# Patient Record
Sex: Female | Born: 1980 | Race: Black or African American | Hispanic: No | Marital: Single
Health system: Midwestern US, Community
[De-identification: ages and names within clinical notes are randomized; demographics above are authoritative.]

## PROBLEM LIST (undated history)

## (undated) DIAGNOSIS — A6000 Herpesviral infection of urogenital system, unspecified: Secondary | ICD-10-CM

## (undated) HISTORY — PX: TUBAL LIGATION: SHX77

---

## 2015-07-29 DIAGNOSIS — R079 Chest pain, unspecified: Secondary | ICD-10-CM

## 2015-07-29 NOTE — ED Triage Notes (Signed)
Patient c/o chest pain that's reproducible lasting for over 24 hrs. Pt recently seen and [facility] for the same complaint at 1pm today. Pt alert/4 vss no acute distress, pt awaiting MD evaluation.

## 2015-07-29 NOTE — Other (Signed)
I have reviewed discharge instructions with the patient.  The patient verbalized understanding.

## 2015-07-29 NOTE — ED Provider Notes (Signed)
HPI Comments: Christina Stewart is a 34 y.o. female who presents to the ED via EMS with complaint of gradually worsening left sided chest pain that began yesterday while lying down. She describes the pain as a "10/10" and pulling sensation. The pain radiates to her Lt scapula and Lt upper arm and is excarbated with palpation. Pain worsens with breathing and movement of arm, shoulder and changing position.  She does not have SOB, but breathing is uncomfortable. She has not taken any medication specifically for the management of her pain, but has taken fioricet for management of her HA. Denies cough, fever, leg swelling, heavy lifting, hx of drug use (specifically cocaine), hx of DVT/PE or the use of oral contraceptives. She has been under a significant amount of stress lately. She was evaluated in the ED at [facility] for a migraine HA earlier today, however she did not address her chest pain while she was there. She also notes an episode of dizziness and lightheadedness ~1 hour after taking Fioricet which has improved since. She has an appointment with her PCP on 08/05/2015.       Patient is a 34 y.o. female presenting with chest pain. The history is provided by the patient and medical records.   Chest Pain (Angina)    This is a new problem. The current episode started yesterday. The problem has been gradually worsening. The problem occurs constantly. The pain is present in the left side. The pain is at a severity of 10/10. The pain is moderate. The quality of the pain is described as tearing sensation. The pain radiates to the mid back and left arm. The symptoms are aggravated by palpation. Associated symptoms include dizziness. Pertinent negatives include no abdominal pain, no back pain, no cough, no diaphoresis, no fever, no headaches, no nausea, no palpitations, no shortness of breath, no vomiting and no weakness. She has tried OTC pain medications for the  symptoms. Risk factors include stress. Her past medical history does not include DVT or PE.        History reviewed. No pertinent past medical history.    History reviewed. No pertinent past surgical history.      History reviewed. No pertinent family history.    Social History     Social History   ??? Marital status: N/A     Spouse name: N/A   ??? Number of children: N/A   ??? Years of education: N/A     Occupational History   ??? Not on file.     Social History Main Topics   ??? Smoking status: Current Some Day Smoker   ??? Smokeless tobacco: Not on file   ??? Alcohol use No   ??? Drug use: Yes     Special: Marijuana   ??? Sexual activity: Yes     Other Topics Concern   ??? Not on file     Social History Narrative   ??? No narrative on file     ALLERGIES: Review of patient's allergies indicates no known allergies.    Review of Systems   Constitutional: Negative.  Negative for diaphoresis and fever.   HENT: Negative.  Negative for rhinorrhea and sore throat.    Respiratory: Negative for cough, chest tightness and shortness of breath.    Cardiovascular: Positive for chest pain. Negative for palpitations and leg swelling.   Gastrointestinal: Negative.  Negative for abdominal pain, nausea and vomiting.   Genitourinary: Negative.  Negative for difficulty urinating and dysuria.   Musculoskeletal: Negative.  Negative for back pain and joint swelling.   Skin: Negative.  Negative for rash and wound.   Allergic/Immunologic: Negative.  Negative for food allergies and immunocompromised state.   Neurological: Positive for dizziness and light-headedness. Negative for weakness and headaches.   All other systems reviewed and are negative.      Vitals:    07/29/15 2016   BP: (!) 129/92   Pulse: 72   Resp: 18   Temp: 98.6 ??F (37 ??C)   SpO2: 99%            Physical Exam   Constitutional: She is oriented to person, place, and time. She appears well-developed and well-nourished. No distress.   Well-appearing, intermittently anxious   HENT:    Head: Normocephalic and atraumatic.   Mouth/Throat: Oropharynx is clear and moist.   Eyes: Conjunctivae are normal. Right eye exhibits no discharge. Left eye exhibits no discharge.   Neck: Normal range of motion. Neck supple.   Cardiovascular: Normal rate, regular rhythm and normal heart sounds.  Exam reveals no gallop and no friction rub.    No murmur heard.  Pulmonary/Chest: Effort normal and breath sounds normal. No respiratory distress. She has no wheezes. She has no rales. She exhibits tenderness (reproduces pain).   Abdominal: Soft. She exhibits no distension and no mass. There is no tenderness. There is no rebound and no guarding.   Musculoskeletal: Normal range of motion. She exhibits tenderness (left upper back, reproduces the pain). She exhibits no edema.   No calf tenderness   Neurological: She is alert and oriented to person, place, and time.   Moving all extremities with no focality   Skin: Skin is warm and dry. No rash noted. She is not diaphoretic.   Nursing note and vitals reviewed.       MDM  Number of Diagnoses or Management Options  Chest pain, unspecified type:   Diagnosis management comments: 34 y.o. female with chest pain. PERC negative, character not consistent with ACS. Pt for screening EKG, pain control and PMD f/u    Plan:   EKG  Tylenol  Heat pack  Reassurance  PMD f/u  -----  8:38 PM  Documented by Ileene Rubens, acting as a scribe for Dr. Lujean Rave, MD      PROVIDER ATTESTATION:  7:43 PM    The entirety of this note, signed by me, accurately reflects all works, treatments, procedures, and medical decision making performed by me, Lujean Rave, MD.        Patient Progress  Patient progress: stable    ED Course     Diagnostic Studies:    EKG 9:16 PM   Viewed by me  Interpreted by me  Abnormal as below:    Normal Sinus Rhythm   Normal rate @ 72 bpm    Normal Axis   Normal ST/T waves   Normal QRS   Normal Intervals   Tachycardic @ rate of:            bpm    Bradycardic @ rate of:             bpm   Nonspecific T-wave inversions   Nonspecific ST changes    LVH     RBBB  LBBB  Paced   Interpretation: incomplete right bundle branch block, No signs of acute ischemia or arrhythmia. no old EKG available for comparison.      Lab Data:  Labs Reviewed - No data to display  ED Course:   8:35 PM  Medical records reviewed on CRISP. Pt has labs from 8:50 AM today from [facility] She had a CBC, BMP, UCG, UA that were normal.  She also had a head CT which was normal.     10:04 PM  Pt reassessed. She reports improvement and her pain is now a 7/10. Pt for DC.    Procedures

## 2015-07-29 NOTE — ED Notes (Signed)
Pt reports having left side chest pain since yesterday the radiates to her middle back and down her left arm. Pt denies any n/v/d. Pt is A&Ox4. No distress noted

## 2015-07-30 ENCOUNTER — Inpatient Hospital Stay
Admit: 2015-07-30 | Discharge: 2015-07-30 | Disposition: A | Discharge status: Home / Self Care | Payer: PRIVATE HEALTH INSURANCE | Attending: Emergency Medicine

## 2015-07-30 LAB — EKG, 12 LEAD, INITIAL
Atrial Rate: 72 {beats}/min
Calculated P Axis: 53 degrees
Calculated R Axis: 41 degrees
Calculated T Axis: 35 degrees
Diagnosis: NORMAL
P-R Interval: 132 ms
Q-T Interval: 404 ms
QRS Duration: 98 ms
QTC Calculation (Bezet): 442 ms
Ventricular Rate: 72 {beats}/min

## 2015-07-30 MED ORDER — ACETAMINOPHEN 325 MG TABLET
325 mg | ORAL | Status: AC
Start: 2015-07-30 — End: 2015-07-29
  Administered 2015-07-30: 02:00:00 via ORAL

## 2015-07-30 MED FILL — TYLENOL 325 MG TABLET: 325 mg | ORAL | Qty: 2

## 2017-05-15 ENCOUNTER — Encounter (HOSPITAL_COMMUNITY): Payer: Self-pay | Admitting: Emergency Medicine

## 2017-05-15 ENCOUNTER — Ambulatory Visit (HOSPITAL_COMMUNITY)
Admission: EM | Admit: 2017-05-15 | Discharge: 2017-05-15 | Disposition: A | Discharge status: Home / Self Care | Payer: Medicaid Other | Attending: Physician Assistant | Admitting: Physician Assistant

## 2017-05-15 DIAGNOSIS — L739 Follicular disorder, unspecified: Secondary | ICD-10-CM | POA: Diagnosis not present

## 2017-05-15 HISTORY — DX: Herpesviral infection of urogenital system, unspecified: A60.00

## 2017-05-15 MED ORDER — DOXYCYCLINE HYCLATE 100 MG PO CAPS: 100.0000 mg | ORAL_CAPSULE | Freq: Two times a day (BID) | ORAL | 0 refills | Status: AC

## 2017-05-15 NOTE — ED Triage Notes (Signed)
[facility] with a complaint of vaginal itching and "2 bumps down there" for 2 days.

## 2017-05-15 NOTE — Discharge Instructions (Signed)
I suggest holding the doxycycline unless you are getting worse. Try a warm compress.

## 2017-05-15 NOTE — ED Notes (Signed)
At bedside for provider exam 

## 2017-05-15 NOTE — ED Triage Notes (Signed)
Reports hx herpes, but hasn't had break-out "in a long time".  Denies any concern for new STDs, states she hasn't been sexually active in a long time.

## 2017-05-15 NOTE — ED Provider Notes (Signed)
CSN: [id number]     Arrival date & time 05/15/17  1644 History   None    Chief Complaint  Patient presents with  . Vaginal Itching     (Consider location/radiation/quality/duration/timing/severity/associated sxs/prior Treatment) HPI  Vaginal itching. 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  This started about 6 days ago.  She feels that she is not worse or better. She has been prescribed Valtrex with good efficacy, but has not needed this for ten years.  Denies new sexual partner. Same female partner for five years now but not in the last month. Denies abnormal vaginal discharge or abdominal pain today.  She is status post tubal ligation.  She does use a razor to shave her vaginal.    Past Medical History:  Diagnosis Date  . Herpes genitalia    History reviewed. No pertinent surgical history. History reviewed. No pertinent family history. Social History  Substance Use Topics  . Smoking status: Never Smoker  . Smokeless tobacco: Never Used  . Alcohol use Yes   OB History    No data available     Review of Systems  Constitutional: Negative for activity change.  HENT: Negative for congestion.   Genitourinary: Negative for decreased urine volume, difficulty urinating, hematuria, menstrual problem, pelvic pain and urgency.  Neurological: Negative for dizziness.    Allergies  Patient has no known allergies.  Home Medications   Prior to Admission medications   Medication Sig Start Date End Date Taking? Authorizing Provider  doxycycline (VIBRAMYCIN) 100 MG capsule Take 1 capsule (100 mg total) by mouth 2 (two) times daily. 05/15/17 05/25/17  Ofilia Neaslark, Shaquna Geigle L, PA-C   Meds Ordered and Administered this Visit  Medications - No data to display  BP 124/74 (BP Location: Right Arm)   Pulse 90   Temp 98.3 F (36.8 C) (Oral)   Resp 16   LMP 05/02/2017 (Exact Date)   SpO2 99%  No data found.   Physical Exam  Constitutional: She appears  well-developed and well-nourished. No distress.  Genitourinary:     Musculoskeletal: Normal range of motion.  Skin: She is not diaphoretic.    Urgent Care Course     Procedures (including critical care time)  Labs Review Labs Reviewed - No data to display    MDM   1. Folliculitis    No new sexual partners.  No vaginal discharge. Mild tenderness about the lesion and I do not appreciate a chancre in any form.  She will try a hot compress and give this more time.  If she begins having worsening pain or fever then she will start abx and RTC as needed.    Ofilia Neaslark, Crystallynn Noorani L, PA-C 05/15/17 1916

## 2017-06-02 ENCOUNTER — Encounter (HOSPITAL_COMMUNITY): Payer: Self-pay

## 2017-06-02 ENCOUNTER — Emergency Department (HOSPITAL_COMMUNITY)
Admission: EM | Admit: 2017-06-02 | Discharge: 2017-06-02 | Disposition: A | Discharge status: Home / Self Care | Payer: Medicaid Other | Attending: Emergency Medicine | Admitting: Emergency Medicine

## 2017-06-02 DIAGNOSIS — Z79899 Other long term (current) drug therapy: Secondary | ICD-10-CM | POA: Insufficient documentation

## 2017-06-02 DIAGNOSIS — H60501 Unspecified acute noninfective otitis externa, right ear: Secondary | ICD-10-CM | POA: Insufficient documentation

## 2017-06-02 DIAGNOSIS — H9201 Otalgia, right ear: Secondary | ICD-10-CM | POA: Diagnosis present

## 2017-06-02 MED ORDER — NEOMYCIN-POLYMYXIN-HC 3.5-10000-1 OT SUSP: 4.0000 [drp] | Freq: Four times a day (QID) | OTIC | 0 refills | Status: DC

## 2017-06-02 MED ORDER — NEOMYCIN-POLYMYXIN-HC 3.5-10000-1 OT SUSP: 3.0000 [drp] | Freq: Four times a day (QID) | OTIC | 0 refills | Status: DC

## 2017-06-02 NOTE — Discharge Instructions (Signed)
You have what appears to be an infection in the ear that is causing your symptoms. Please use the prescribed ear drops as directed: 4 drops, four times a day, for 7 days. Refrain from submerging the ear. Do not stick objects in the ear. Follow up with a primary care provider for any further management of this issue.

## 2017-06-02 NOTE — ED Triage Notes (Signed)
Patient c/o right ear pain x 2 days and dizziness that started yesterday.

## 2017-06-02 NOTE — ED Provider Notes (Signed)
[facility]-EMERGENCY DEPT Provider Note   CSN: [id number] Arrival date & time: 06/02/17  1809     History   Chief Complaint Chief Complaint  Patient presents with  . Otalgia    right  . Dizziness    HPI Ashlee Booth is a 36 y.o. female.  HPI   Ashlee Booth is a 36 y.o. female, with a history of Herpes, presenting to the ED with right ear pain for the past 2 days. Accompanied by dizziness beginning yesterday. Pain is moderate, aching, nonradiating. Accompanied by congestion. Has not taken any medications for this. Denies fever/chills, sore throat, ear drainage, falls, hearing deficit, or any other complaints.   Past Medical History:  Diagnosis Date  . Herpes genitalia     There are no active problems to display for this patient.   History reviewed. No pertinent surgical history.  OB History    No data available       Home Medications    Prior to Admission medications   Medication Sig Start Date End Date Taking? Authorizing Provider  neomycin-polymyxin-hydrocortisone (CORTISPORIN) 3.5-10000-1 OTIC suspension Place 4 drops into the right ear 4 (four) times daily. Use for 7 days. 06/02/17   Joy, Hillard DankerShawn C, PA-C    Family History No family history on file.  Social History Social History  Substance Use Topics  . Smoking status: Never Smoker  . Smokeless tobacco: Never Used  . Alcohol use Yes     Allergies   Patient has no known allergies.   Review of Systems Review of Systems  Constitutional: Negative for chills and fever.  HENT: Positive for congestion and ear pain. Negative for ear discharge, facial swelling and sore throat.   Gastrointestinal: Negative for nausea and vomiting.  Neurological: Positive for dizziness. Negative for syncope and headaches.  All other systems reviewed and are negative.    Physical Exam Updated Vital Signs BP 117/84 (BP Location: Right Arm)   Pulse 83   Temp 98.5 F (36.9 C) (Oral)   Resp 16   Ht 5\' 7"  (1.702 m)   Wt 70.3  kg (155 lb)   LMP 06/02/2017   SpO2 97%   BMI 24.28 kg/m   Physical Exam  Constitutional: She is oriented to person, place, and time. She appears well-developed and well-nourished. No distress.  HENT:  Head: Normocephalic and atraumatic.  Right Ear: Tympanic membrane and external ear normal.  Left Ear: Tympanic membrane, external ear and ear canal normal.  Mouth/Throat: Oropharynx is clear and moist.  Erythematous canal with debris on the right. Tender tragus on the right.   Eyes: Conjunctivae and EOM are normal. Pupils are equal, round, and reactive to light.  Neck: Normal range of motion. Neck supple.  Cardiovascular: Normal rate, regular rhythm, normal heart sounds and intact distal pulses.   Pulmonary/Chest: Effort normal and breath sounds normal. No respiratory distress.  Abdominal: Soft. There is no tenderness. There is no guarding.  Musculoskeletal: She exhibits no edema.  Normal motor function intact in all extremities and spine. No midline spinal tenderness.   Lymphadenopathy:    She has no cervical adenopathy.  Neurological: She is alert and oriented to person, place, and time.  No sensory deficits. Strength 5/5 in all extremities. No gait disturbance. Coordination intact including heel to shin and finger to nose. Cranial nerves III-XII grossly intact. No facial droop.   Skin: Skin is warm and dry. She is not diaphoretic.  Psychiatric: She has a normal mood and affect. Her behavior is normal.  Nursing note and vitals reviewed.    ED Treatments / Results  Labs (all labs ordered are listed, but only abnormal results are displayed) Labs Reviewed - No data to display  EKG  EKG Interpretation None       Radiology No results found.  Procedures Procedures (including critical care time)  Medications Ordered in ED Medications - No data to display   Initial Impression / Assessment and Plan / ED Course  I have reviewed the triage vital signs and the nursing  notes.  Pertinent labs & imaging results that were available during my care of the patient were reviewed by me and considered in my medical decision making (see chart for details).     Patient presents with ear pain and subsequent dizziness. No neuro or functional deficits. Patient is nontoxic appearing, afebrile, not tachycardic, not tachypneic, not hypotensive, maintains excellent SPO2 on room air, and is in no apparent distress. Suspect otitis external. The patient was given instructions for home care as well as return precautions. Patient voices understanding of these instructions, accepts the plan, and is comfortable with discharge.  Vitals:   06/02/17 1845 06/02/17 1847 06/02/17 2119  BP: 117/84  125/83  Pulse: 83  71  Resp: 16  19  Temp: 98.5 F (36.9 C)    TempSrc: Oral    SpO2: 97%  99%  Weight:  70.3 kg (155 lb)   Height:  5\' 7"  (1.702 m)      Final Clinical Impressions(s) / ED Diagnoses   Final diagnoses:  Acute otitis externa of right ear, unspecified type    New Prescriptions Discharge Medication List as of 06/02/2017  9:11 PM       Anselm Pancoast, PA-C 06/03/17 1608    Alvira Monday, MD 06/04/17 0700

## 2017-06-02 NOTE — ED Notes (Signed)
Pt ambulatory and independent at discharge.  Verbalized understanding of discharge instructions 

## 2017-11-27 ENCOUNTER — Emergency Department (HOSPITAL_COMMUNITY): Payer: Self-pay

## 2017-11-27 ENCOUNTER — Encounter (HOSPITAL_COMMUNITY): Payer: Self-pay | Admitting: Emergency Medicine

## 2017-11-27 ENCOUNTER — Emergency Department (HOSPITAL_COMMUNITY)
Admission: EM | Admit: 2017-11-27 | Discharge: 2017-11-28 | Disposition: A | Discharge status: Home / Self Care | Payer: Self-pay | Attending: Emergency Medicine | Admitting: Emergency Medicine

## 2017-11-27 DIAGNOSIS — Z79899 Other long term (current) drug therapy: Secondary | ICD-10-CM | POA: Insufficient documentation

## 2017-11-27 DIAGNOSIS — R079 Chest pain, unspecified: Secondary | ICD-10-CM | POA: Insufficient documentation

## 2017-11-27 LAB — CBC
HEMATOCRIT: 39.8 % (ref 36.0–46.0)
HEMOGLOBIN: 13.4 g/dL (ref 12.0–15.0)
MCH: 29.8 pg (ref 26.0–34.0)
MCHC: 33.7 g/dL (ref 30.0–36.0)
MCV: 88.4 fL (ref 78.0–100.0)
Platelets: 238 10*3/uL (ref 150–400)
RBC: 4.5 MIL/uL (ref 3.87–5.11)
RDW: 12.8 % (ref 11.5–15.5)
WBC: 5 10*3/uL (ref 4.0–10.5)

## 2017-11-27 LAB — I-STAT TROPONIN, ED: TROPONIN I, POC: 0 ng/mL (ref 0.00–0.08)

## 2017-11-27 LAB — I-STAT BETA HCG BLOOD, ED (MC, WL, AP ONLY)

## 2017-11-27 NOTE — ED Triage Notes (Signed)
Pt from home with c/o left sided cp and arm numbness x 1 week. Pt states she has had same symptoms before but was told there was nothing abnormal. Pt has strong bilateral radial pulses. Pt denies radiation of pain.

## 2017-11-27 NOTE — ED Notes (Signed)
Bed: [id number] Expected date:  Expected time:  Means of arrival:  Comments: No bed no monitor

## 2017-11-28 LAB — BASIC METABOLIC PANEL
ANION GAP: 8 (ref 5–15)
BUN: 10 mg/dL (ref 6–20)
CALCIUM: 9.1 mg/dL (ref 8.9–10.3)
CO2: 24 mmol/L (ref 22–32)
Chloride: 107 mmol/L (ref 101–111)
Creatinine, Ser: 0.97 mg/dL (ref 0.44–1.00)
GFR calc Af Amer: >60 mL/min (ref >60)
GLUCOSE: 114 mg/dL — AB (ref 65–99)
POTASSIUM: 3.6 mmol/L (ref 3.5–5.1)
SODIUM: 139 mmol/L (ref 135–145)

## 2017-11-28 MED ORDER — NAPROXEN 500 MG PO TABS: 500.0000 mg | ORAL_TABLET | Freq: Two times a day (BID) | ORAL | 0 refills | Status: DC | PRN

## 2017-11-28 MED ORDER — LORAZEPAM 1 MG PO TABS
1.0000 mg | ORAL_TABLET | Freq: Once | ORAL | Status: AC
  Administered 2017-11-28: 1 mg via ORAL
  Filled 2017-11-28: qty 1

## 2017-11-28 NOTE — ED Provider Notes (Signed)
[facility]-EMERGENCY DEPT Provider Note   CSN: [id number] Arrival date & time: 11/27/17  2234    History   Chief Complaint Chief Complaint  Patient presents with  . Chest Pain    HPI Ashlee Booth is a 36 y.o. female.  36 year old female with no significant past medical history presents to the emergency department for evaluation of left-sided chest pain.  She reports intermittent chest pain over the past week which radiates towards her shoulder causing paresthesias in her left arm.  She denies taking any medications for symptoms as well as any known modifying factors of her discomfort.  She does report increased stress lately and states that she experienced similar pain in the past which improved with Xanax.  She has not had any falls, trauma, injury.  Pain is not worse with deep breathing or with eating.  She denies associated shortness of breath, fever, leg swelling, hemoptysis, nausea, vomiting, extremity numbness or weakness.  No syncope or near syncope, recent surgeries, recent hospitalizations, or birth control use.  She denies a personal history of diabetes, hypertension, dyslipidemia.  No family history of sudden cardiac death or other heart disease.      Past Medical History:  Diagnosis Date  . Herpes genitalia     There are no active problems to display for this patient.   History reviewed. No pertinent surgical history.  OB History    No data available       Home Medications    Prior to Admission medications   Medication Sig Start Date End Date Taking? Authorizing Provider  cetirizine (ZYRTEC) 10 MG tablet Take 10 mg by mouth daily as needed for allergies.   Yes [provider]  naproxen (NAPROSYN) 500 MG tablet Take 1 tablet (500 mg total) by mouth every 12 (twelve) hours as needed for mild pain or moderate pain. 11/28/17   Antony MaduraHumes, Shemika Robbs, PA-C  neomycin-polymyxin-hydrocortisone (CORTISPORIN) 3.5-10000-1 OTIC suspension Place 4  drops into the right ear 4 (four) times daily. Use for 7 days. Patient not taking: Reported on 11/28/2017 06/02/17   Anselm PancoastJoy, Shawn C, PA-C    Family History No family history on file.  Social History Social History   Tobacco Use  . Smoking status: Never Smoker  . Smokeless tobacco: Never Used  Substance Use Topics  . Alcohol use: Yes  . Drug use: No     Allergies   Patient has no known allergies.   Review of Systems Review of Systems Ten systems reviewed and are negative for acute change, except as noted in the HPI.    Physical Exam Updated Vital Signs BP 135/79 (BP Location: Right Arm)   Pulse 98   Temp 98 F (36.7 C) (Oral)   Resp 16   LMP 11/20/2017   SpO2 100%   Physical Exam  Constitutional: She is oriented to person, place, and time. She appears well-developed and well-nourished. No distress.  Nontoxic appearing and in NAD  HENT:  Head: Normocephalic and atraumatic.  Eyes: Conjunctivae and EOM are normal. No scleral icterus.  Neck: Normal range of motion.  No JVD  Cardiovascular: Normal rate, regular rhythm and intact distal pulses.  Pulmonary/Chest: Effort normal. No stridor. No respiratory distress. She has no wheezes. She has no rales.  Lungs CTAB. Respirations even and unlabored.  Musculoskeletal: Normal range of motion.  Neurological: She is alert and oriented to person, place, and time. She exhibits normal muscle tone. Coordination normal.  GCS 15. Patient moving all extremities.  Skin:  Skin is warm and dry. No rash noted. She is not diaphoretic. No erythema. No pallor.  Psychiatric: She has a normal mood and affect. Her behavior is normal.  Nursing note and vitals reviewed.    ED Treatments / Results  Labs (all labs ordered are listed, but only abnormal results are displayed) Labs Reviewed  BASIC METABOLIC PANEL - Abnormal; Notable for the following components:      Result Value   Glucose, Bld 114 (*)    All other components within normal  limits  CBC  I-STAT TROPONIN, ED  I-STAT BETA HCG BLOOD, ED ([facility], [facility], [facility] ONLY)    EKG  EKG Interpretation  Date/Time:  "Sunday November 27 2017 22:46:56 EST Ventricular Rate:  91 PR Interval:    QRS Duration: 98 QT Interval:  377 QTC Calculation: 464 R Axis:   54 Text Interpretation:  Sinus rhythm Probable left atrial enlargement RSR' in V1 or V2, right VCD or RVH Baseline wander in lead(s) V4 Confirmed by DeLo, Douglas ([id number]) on 11/27/2017 10:59:05 PM       Radiology Dg Chest 2 View  Result Date: 11/27/2017 CLINICAL DATA:  Left-sided anterior chest pain x1 week. EXAM: CHEST  2 VIEW COMPARISON:  None. FINDINGS: The heart size and mediastinal contours are within normal limits. Both lungs are clear. The visualized skeletal structures are unremarkable. IMPRESSION: No active cardiopulmonary disease. Electronically Signed   By: David  Kwon M.D.   On: 11/27/2017 23:07    Procedures Procedures (including critical care time)  Medications Ordered in ED Medications  LORazepam (ATIVAN) tablet 1 mg (1 mg Oral Given 11/28/17 0048)     Initial Impression / Assessment and Plan / ED Course  I have reviewed the triage vital signs and the nursing notes.  Pertinent labs & imaging results that were available during my care of the patient were reviewed by me and considered in my medical decision making (see chart for details).     36 year old female presents to the emergency department for evaluation of chest pain.  Pain is not reproducible on palpation, but patient has experienced similar pain in the past in the setting of worsening stress/anxiety.  She does report mild increase to her stress level recently.  She has not had any trauma or falls.    Cardiac workup today is reassuring.  Heart score consistent with low risk of acute coronary event.  No tachypnea, dyspnea, hypoxia, tachycardia or risk factors for pulmonary embolus.  Chest x-ray shows no evidence of acute cardiopulmonary process.   Low suspicion for emergent cause of symptoms.  Plan for continued supportive management with anti-inflammatories.  Patient has a primary care doctor with whom she can follow-up.  Return precautions discussed and provided. Patient discharged in stable condition with no unaddressed concerns.   Final Clinical Impressions(s) / ED Diagnoses   Final diagnoses:  Nonspecific chest pain    ED Discharge Orders        Ordered    naproxen (NAPROSYN) 500 MG tablet  Every 12 hours PRN     12" /17/18 0049       Antony MaduraHumes, Quantavia Frith, PA-C 11/28/17 0341    Geoffery Lyonselo, Douglas, MD 11/28/17 [phone]

## 2019-03-20 ENCOUNTER — Emergency Department (HOSPITAL_COMMUNITY): Payer: Self-pay

## 2019-03-20 ENCOUNTER — Other Ambulatory Visit: Payer: Self-pay

## 2019-03-20 ENCOUNTER — Encounter (HOSPITAL_COMMUNITY): Payer: Self-pay | Admitting: Emergency Medicine

## 2019-03-20 ENCOUNTER — Emergency Department (HOSPITAL_COMMUNITY)
Admission: EM | Admit: 2019-03-20 | Discharge: 2019-03-20 | Disposition: A | Discharge status: Home / Self Care | Payer: Self-pay | Attending: Emergency Medicine | Admitting: Emergency Medicine

## 2019-03-20 DIAGNOSIS — R109 Unspecified abdominal pain: Secondary | ICD-10-CM

## 2019-03-20 DIAGNOSIS — N3000 Acute cystitis without hematuria: Secondary | ICD-10-CM | POA: Insufficient documentation

## 2019-03-20 LAB — COMPREHENSIVE METABOLIC PANEL
ALT: 15 U/L (ref 0–44)
AST: 21 U/L (ref 15–41)
Albumin: 4.4 g/dL (ref 3.5–5.0)
Alkaline Phosphatase: 48 U/L (ref 38–126)
Anion gap: 8 (ref 5–15)
BUN: 8 mg/dL (ref 6–20)
CO2: 23 mmol/L (ref 22–32)
Calcium: 8.8 mg/dL — ABNORMAL LOW (ref 8.9–10.3)
Chloride: 106 mmol/L (ref 98–111)
Creatinine, Ser: 0.91 mg/dL (ref 0.44–1.00)
GFR calc Af Amer: >60 mL/min (ref >60)
GFR calc non Af Amer: >60 mL/min (ref >60)
Glucose, Bld: 102 mg/dL — ABNORMAL HIGH (ref 70–99)
Potassium: 3.6 mmol/L (ref 3.5–5.1)
Sodium: 137 mmol/L (ref 135–145)
Total Bilirubin: 0.4 mg/dL (ref 0.3–1.2)
Total Protein: 7.5 g/dL (ref 6.5–8.1)

## 2019-03-20 LAB — CBC WITH DIFFERENTIAL/PLATELET
Abs Immature Granulocytes: 0.01 10*3/uL (ref 0.00–0.07)
Basophils Absolute: 0.1 10*3/uL (ref 0.0–0.1)
Basophils Relative: 1 %
Eosinophils Absolute: 0.2 10*3/uL (ref 0.0–0.5)
Eosinophils Relative: 4 %
HCT: 45.9 % (ref 36.0–46.0)
Hemoglobin: 14.8 g/dL (ref 12.0–15.0)
Immature Granulocytes: 0 %
Lymphocytes Relative: 39 %
Lymphs Abs: 2.2 10*3/uL (ref 0.7–4.0)
MCH: 29.9 pg (ref 26.0–34.0)
MCHC: 32.2 g/dL (ref 30.0–36.0)
MCV: 92.7 fL (ref 80.0–100.0)
Monocytes Absolute: 0.4 10*3/uL (ref 0.1–1.0)
Monocytes Relative: 6 %
Neutro Abs: 2.7 10*3/uL (ref 1.7–7.7)
Neutrophils Relative %: 50 %
Platelets: 227 10*3/uL (ref 150–400)
RBC: 4.95 MIL/uL (ref 3.87–5.11)
RDW: 13.1 % (ref 11.5–15.5)
WBC: 5.5 10*3/uL (ref 4.0–10.5)
nRBC: 0 % (ref 0.0–0.2)

## 2019-03-20 LAB — LIPASE, BLOOD: Lipase: 34 U/L (ref 11–51)

## 2019-03-20 LAB — URINALYSIS, ROUTINE W REFLEX MICROSCOPIC
Bilirubin Urine: NEGATIVE
Glucose, UA: NEGATIVE mg/dL
Ketones, ur: NEGATIVE mg/dL
Nitrite: NEGATIVE
Protein, ur: NEGATIVE mg/dL
Specific Gravity, Urine: 1.003 — ABNORMAL LOW (ref 1.005–1.030)
pH: 6 (ref 5.0–8.0)

## 2019-03-20 LAB — LACTIC ACID, PLASMA: Lactic Acid, Venous: 0.9 mmol/L (ref 0.5–1.9)

## 2019-03-20 LAB — PREGNANCY, URINE: Preg Test, Ur: NEGATIVE

## 2019-03-20 MED ORDER — ALUM & MAG HYDROXIDE-SIMETH 200-200-20 MG/5ML PO SUSP
30.0000 mL | Freq: Once | ORAL | Status: AC
  Administered 2019-03-20: 30 mL via ORAL
  Filled 2019-03-20: qty 30

## 2019-03-20 MED ORDER — IOHEXOL 300 MG/ML  SOLN
100.0000 mL | Freq: Once | INTRAMUSCULAR | Status: AC | PRN
  Administered 2019-03-20: 100 mL via INTRAVENOUS

## 2019-03-20 MED ORDER — ONDANSETRON HCL 4 MG/2ML IJ SOLN
4.0000 mg | Freq: Once | INTRAMUSCULAR | Status: AC
  Administered 2019-03-20: 4 mg via INTRAVENOUS
  Filled 2019-03-20: qty 2

## 2019-03-20 MED ORDER — SODIUM CHLORIDE (PF) 0.9 % IJ SOLN
INTRAMUSCULAR | Status: AC
  Filled 2019-03-20: qty 50

## 2019-03-20 MED ORDER — LIDOCAINE VISCOUS HCL 2 % MT SOLN
15.0000 mL | Freq: Once | OROMUCOSAL | Status: AC
  Administered 2019-03-20: 15 mL via ORAL
  Filled 2019-03-20: qty 15

## 2019-03-20 MED ORDER — CEPHALEXIN 500 MG PO CAPS: 500.0000 mg | ORAL_CAPSULE | Freq: Two times a day (BID) | ORAL | 0 refills | Status: AC

## 2019-03-20 MED ORDER — MORPHINE SULFATE (PF) 4 MG/ML IV SOLN
4.0000 mg | Freq: Once | INTRAVENOUS | Status: AC
  Administered 2019-03-20: 4 mg via INTRAVENOUS
  Filled 2019-03-20: qty 1

## 2019-03-20 NOTE — ED Triage Notes (Signed)
Pt reports sharp pain in left flank for the last 3 weeks.

## 2019-03-20 NOTE — ED Provider Notes (Signed)
[facility]-EMERGENCY DEPT Provider Note   CSN: [id number] Arrival date & time: 03/20/19  0343    History   Chief Complaint Chief Complaint  Patient presents with  . Flank Pain    HPI Ashlee Booth is a 38 y.o. female.     The history is provided by the patient and medical records.  Flank Pain  This is a new problem. The current episode started more than 1 week ago. The problem occurs constantly. The problem has not changed since onset.Associated symptoms include abdominal pain. Pertinent negatives include no chest pain, no headaches and no shortness of breath. Nothing aggravates the symptoms. Nothing relieves the symptoms. She has tried nothing for the symptoms. The treatment provided no relief.    Past Medical History:  Diagnosis Date  . Herpes genitalia     There are no active problems to display for this patient.   History reviewed. No pertinent surgical history.   OB History   No obstetric history on file.      Home Medications    Prior to Admission medications   Medication Sig Start Date End Date Taking? Authorizing Provider  cetirizine (ZYRTEC) 10 MG tablet Take 10 mg by mouth daily as needed for allergies.    [provider]  naproxen (NAPROSYN) 500 MG tablet Take 1 tablet (500 mg total) by mouth every 12 (twelve) hours as needed for mild pain or moderate pain. 11/28/17   Antony Madura, PA-C  neomycin-polymyxin-hydrocortisone (CORTISPORIN) 3.5-10000-1 OTIC suspension Place 4 drops into the right ear 4 (four) times daily. Use for 7 days. Patient not taking: Reported on 11/28/2017 06/02/17   Anselm Pancoast, PA-C    Family History History reviewed. No pertinent family history.  Social History Social History   Tobacco Use  . Smoking status: Never Smoker  . Smokeless tobacco: Never Used  Substance Use Topics  . Alcohol use: Yes  . Drug use: No     Allergies   Patient has no known allergies.   Review of Systems Review of  Systems  Constitutional: Negative for chills, diaphoresis, fatigue and fever.  HENT: Negative for congestion and rhinorrhea.   Eyes: Negative for visual disturbance.  Respiratory: Negative for cough, chest tightness, shortness of breath, wheezing and stridor.   Cardiovascular: Negative for chest pain, palpitations and leg swelling.  Gastrointestinal: Positive for abdominal pain. Negative for abdominal distention, constipation, diarrhea, nausea and vomiting.  Genitourinary: Positive for flank pain and frequency. Negative for decreased urine volume, dysuria, vaginal bleeding, vaginal discharge and vaginal pain.  Musculoskeletal: Negative for neck pain and neck stiffness.  Skin: Negative for rash and wound.  Neurological: Negative for light-headedness, numbness and headaches.  All other systems reviewed and are negative.    Physical Exam Updated Vital Signs BP 109/67 (BP Location: Left Arm)   Pulse 69   Temp 97.9 F (36.6 C) (Oral)   Resp 17   Ht  (1.702 m)   Wt 65.8 kg   LMP 02/20/2019   SpO2 99%   BMI 22.71 kg/m   Physical Exam Vitals signs and nursing note reviewed.  Constitutional:      General: She is not in acute distress.    Appearance: She is well-developed. She is not diaphoretic.  HENT:     Head: Normocephalic and atraumatic.     Right Ear: External ear normal.     Left Ear: External ear normal.     Nose: Nose normal.     Mouth/Throat:  Pharynx: No oropharyngeal exudate.  Eyes:     Conjunctiva/sclera: Conjunctivae normal.     Pupils: Pupils are equal, round, and reactive to light.  Neck:     Musculoskeletal: Normal range of motion and neck supple.  Pulmonary:     Effort: No respiratory distress.     Breath sounds: No stridor.  Abdominal:     General: Abdomen is flat. Bowel sounds are normal. There is no distension.     Tenderness: There is abdominal tenderness in the left upper quadrant. There is no right CVA tenderness, left CVA tenderness, guarding  or rebound.    Skin:    General: Skin is warm.     Findings: No erythema or rash.  Neurological:     Mental Status: She is alert and oriented to person, place, and time.     Motor: No abnormal muscle tone.     Coordination: Coordination normal.     Deep Tendon Reflexes: Reflexes are normal and symmetric.      ED Treatments / Results  Labs (all labs ordered are listed, but only abnormal results are displayed) Labs Reviewed  URINALYSIS, ROUTINE W REFLEX MICROSCOPIC - Abnormal; Notable for the following components:      Result Value   Color, Urine STRAW (*)    Specific Gravity, Urine 1.003 (*)    Hgb urine dipstick SMALL (*)    Leukocytes,Ua TRACE (*)    Bacteria, UA RARE (*)    All other components within normal limits  COMPREHENSIVE METABOLIC PANEL - Abnormal; Notable for the following components:   Glucose, Bld 102 (*)    Calcium 8.8 (*)    All other components within normal limits  URINE CULTURE  PREGNANCY, URINE  CBC WITH DIFFERENTIAL/PLATELET  LIPASE, BLOOD  LACTIC ACID, PLASMA  LACTIC ACID, PLASMA    EKG None  Radiology Ct Abdomen Pelvis W Contrast  Result Date: 03/20/2019 CLINICAL DATA:  Sharp left flank pain for 3 weeks EXAM: CT ABDOMEN AND PELVIS WITH CONTRAST TECHNIQUE: Multidetector CT imaging of the abdomen and pelvis was performed using the standard protocol following bolus administration of intravenous contrast. CONTRAST:  100mL OMNIPAQUE IOHEXOL 300 MG/ML  SOLN COMPARISON:  None. FINDINGS: Lower chest:  No contributory findings. Hepatobiliary: Multiple hepatic cysts.No evidence of biliary obstruction or stone. Pancreas: Unremarkable. Spleen: Unremarkable. Adrenals/Urinary Tract: Negative adrenals. No hydronephrosis or stone. Unremarkable bladder. Stomach/Bowel: No obstruction. No appendicitis. There are a few left colonic diverticula. Vascular/Lymphatic: No acute vascular abnormality. No mass or adenopathy. Reproductive:18 mm anterior intramural fibroid.  Other: No ascites or pneumoperitoneum. Musculoskeletal: No acute abnormalities. IMPRESSION: 1. No acute finding. 2. Small intramural fibroid. 3. Mild left colonic diverticulosis. Electronically Signed   By: Marnee SpringJonathon  Watts M.D.   On: 03/20/2019 06:15    Procedures Procedures (including critical care time)  Medications Ordered in ED Medications  sodium chloride (PF) 0.9 % injection (has no administration in time range)  morphine 4 MG/ML injection 4 mg (4 mg Intravenous Given 03/20/19 0503)  alum & mag hydroxide-simeth (MAALOX/MYLANTA) 200-200-20 MG/5ML suspension 30 mL (30 mLs Oral Given 03/20/19 0506)    And  lidocaine (XYLOCAINE) 2 % viscous mouth solution 15 mL (15 mLs Oral Given 03/20/19 0506)  iohexol (OMNIPAQUE) 300 MG/ML solution 100 mL (100 mLs Intravenous Contrast Given 03/20/19 0554)  ondansetron (ZOFRAN) injection 4 mg (4 mg Intravenous Given 03/20/19 0550)     Initial Impression / Assessment and Plan / ED Course  I have reviewed the triage vital signs and the nursing  notes.  Pertinent labs & imaging results that were available during my care of the patient were reviewed by me and considered in my medical decision making (see chart for details).        Ashlee Booth is a 38 y.o. female with no significant past medical history who presents with left-sided abdominal pain for the last 3 weeks.  Of note, patient does report she has had increasing alcohol use over the last few weeks after loss of a parent.  She denies trauma.  She reports no significant nausea, vomiting, constipation, or diarrhea.  She reports normal bowel movement yesterday.  She denies change in her menstrual cycle.  She denies any vaginal discharge or vaginal bleeding.  No history of ovarian cyst or ovarian torsion.  No history of diverticulitis, kidney stones, gallbladder troubles, or appendicitis.  No history of obstructions.  She reports pain is severe in her left abdomen, worse in the left upper quadrant.  She denies  lower abdominal tenderness or pain.  She reports she is had urinary frequency which is new however.  No fevers, chills, congestion, or cough.  No chest pain or shortness of breath.  Patient otherwise resting comfortably.  On exam, patient has left upper quadrant and mid left abdominal tenderness.  No CVA tenderness.  Normal bowel sounds.  Lungs clear and chest nontender.   Clinically I am concerned about pancreatitis versus diverticulitis.  Patient will be given pain medicine and have CT imaging and labs to further evaluate.  We will also give GI cocktail given concern for gastritis with stress and left upper quadrant pain.  Anticipate reassessment after work-up.  Patient's imaging showed mild diverticulosis but was otherwise reassuring.  No evidence of inflammation or diverticulitis.  Imaging overall reassuring.  CBC and CMP reassuring.  Urinalysis shows leukocytes and bacteria, given the patient's abdominal discomfort and her urinary frequency, she will be treated for UTI.  Patient will follow-up with PCP and understood return precautions.  With the UTI finding, PT  agreed with holding on further GU work-up.  Patient other questions or concerns and was discharged in good condition.   Final Clinical Impressions(s) / ED Diagnoses   Final diagnoses:  Left flank pain  Acute cystitis without hematuria    ED Discharge Orders         Ordered    cephALEXin (KEFLEX) 500 MG capsule  2 times daily     03/20/19 0659          Clinical Impression: 1. Left flank pain   2. Acute cystitis without hematuria     Disposition: Discharge  Condition: Good  I have discussed the results, Dx and Tx plan with the pt(& family if present). He/she/they expressed understanding and agree(s) with the plan. Discharge instructions discussed at great length. Strict return precautions discussed and pt &/or family have verbalized understanding of the instructions. No further questions at time of discharge.     Discharge Medication List as of 03/20/2019  6:59 AM    START taking these medications   Details  cephALEXin (KEFLEX) 500 MG capsule Take 1 capsule (500 mg total) by mouth 2 (two) times daily for 7 days., Starting Tue 03/20/2019, Until Tue 03/27/2019, Print        Follow Up: [facility] [address] [place] [address] [phone] Schedule an appointment as soon as possible for a visit    [facility]-EMERGENCY DEPT [address] [id number] [place]  [address] [phone]       Tegeler, Canary Brim, MD 03/20/19 [phone]

## 2019-03-20 NOTE — Discharge Instructions (Signed)
With your urinary frequency and abdominal discomfort, we suspect you have urinary tract infection based on the lab work.  The CT imaging was reassuring with no evidence of stones or other significant abnormality causing her pain.  As her symptoms have improved, we feel you are safe for discharge home with prescription for antibiotics.  Please follow-up with your primary doctor.  If any symptoms change or worsen, please return to the nearest emergency department.

## 2019-03-21 LAB — URINE CULTURE: Culture: NO GROWTH

## 2019-07-01 ENCOUNTER — Other Ambulatory Visit: Payer: Self-pay

## 2019-07-01 ENCOUNTER — Emergency Department (HOSPITAL_COMMUNITY): Payer: Medicaid Other

## 2019-07-01 ENCOUNTER — Encounter (HOSPITAL_COMMUNITY): Payer: Self-pay

## 2019-07-01 ENCOUNTER — Emergency Department (HOSPITAL_COMMUNITY)
Admission: EM | Admit: 2019-07-01 | Discharge: 2019-07-01 | Disposition: A | Discharge status: Home / Self Care | Payer: Medicaid Other | Attending: Emergency Medicine | Admitting: Emergency Medicine

## 2019-07-01 DIAGNOSIS — M25511 Pain in right shoulder: Secondary | ICD-10-CM | POA: Insufficient documentation

## 2019-07-01 MED ORDER — DICLOFENAC SODIUM 1 % TD GEL: 2.0000 g | Freq: Four times a day (QID) | TRANSDERMAL | 0 refills | Status: DC

## 2019-07-01 MED ORDER — NAPROXEN 500 MG PO TABS
500.0000 mg | ORAL_TABLET | Freq: Once | ORAL | Status: AC
  Administered 2019-07-01: 500 mg via ORAL
  Filled 2019-07-01: qty 1

## 2019-07-01 MED ORDER — NAPROXEN 500 MG PO TABS: 500.0000 mg | ORAL_TABLET | Freq: Two times a day (BID) | ORAL | 0 refills | Status: DC

## 2019-07-01 NOTE — Discharge Instructions (Signed)
1. Medications: alternate naprosyn and tylenol for pain control, Voltaren for pain and inflammation, usual home medications 2. Treatment: rest, ice, elevate and use brace, drink plenty of fluids, gentle stretching 3. Follow Up: Please followup with orthopedics as directed or your PCP in 1 week if no improvement for discussion of your diagnoses and further evaluation after today's visit; if you do not have a primary care doctor use the resource guide provided to find one; Please return to the ER for worsening symptoms or other concerns

## 2019-07-01 NOTE — ED Provider Notes (Signed)
[facility]-EMERGENCY DEPT Provider Note   CSN: [id number] Arrival date & time: 07/01/19  0205    History   Chief Complaint Chief Complaint  Patient presents with   Shoulder Pain    R    HPI Ashlee Booth is a 38 y.o. female with a hx of no major medical problems presents to the Emergency Department complaining of gradual, persistent, progressively worsening right shoulder pain onset 2 days ago.  Pt reports pain is worse with movement.  She reports decreased ROM due to pain.  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  She denies new activities, falls.  Pt denies IVDU, fever or joint swelling.  Denies numbness and weakness.  Pt is right handed.  She has used icy-hot with moderate relief, but pain returns.      The history is provided by the patient and medical records. No language interpreter was used.    Past Medical History:  Diagnosis Date   Herpes genitalia     There are no active problems to display for this patient.   History reviewed. No pertinent surgical history.   OB History   No obstetric history on file.      Home Medications    Prior to Admission medications   Medication Sig Start Date End Date Taking? Authorizing Provider  cetirizine (ZYRTEC) 10 MG tablet Take 10 mg by mouth daily.   Yes [provider]  diclofenac sodium (VOLTAREN) 1 % GEL Apply 2 g topically 4 (four) times daily. 07/01/19   Jayani Rozman, Dahlia ClientHannah, PA-C  naproxen (NAPROSYN) 500 MG tablet Take 1 tablet (500 mg total) by mouth 2 (two) times daily with a meal. 07/01/19   Korin Setzler, Boyd KerbsHannah, PA-C    Family History History reviewed. No pertinent family history.  Social History Social History   Tobacco Use   Smoking status: Never Smoker   Smokeless tobacco: Never Used  Substance Use Topics   Alcohol use: Yes   Drug use: No     Allergies   Patient has no known  allergies.   Review of Systems Review of Systems  Constitutional: Negative for chills and fever.  Gastrointestinal: Negative for nausea and vomiting.  Musculoskeletal: Positive for arthralgias. Negative for back pain, joint swelling, neck pain and neck stiffness.  Skin: Negative for wound.  Neurological: Negative for numbness.  Hematological: Does not bruise/bleed easily.  Psychiatric/Behavioral: The patient is not nervous/anxious.   All other systems reviewed and are negative.    Physical Exam Updated Vital Signs BP 114/67 (BP Location: Left Arm)    Pulse (!) 55    Temp 98.4 F (36.9 C) (Oral)    Resp 17    Ht 5\' 8"  (1.727 m)    Wt 68 kg    LMP 06/16/2019    SpO2 98%    BMI 22.81 kg/m   Physical Exam Vitals signs and nursing note reviewed.  Constitutional:      General: She is not in acute distress.    Appearance: She is well-developed. She is not diaphoretic.  HENT:     Head: Normocephalic and atraumatic.  Eyes:     Conjunctiva/sclera: Conjunctivae normal.  Neck:     Musculoskeletal: Normal range of motion.  Cardiovascular:     Rate and Rhythm: Normal rate and regular rhythm.     Pulses:          Radial pulses are 2+ on the  right side and 2+ on the left side.     Comments: Capillary refill < 3 sec Pulmonary:     Effort: Pulmonary effort is normal.     Breath sounds: Normal breath sounds.  Musculoskeletal:        General: Tenderness present.     Right shoulder: She exhibits decreased range of motion, tenderness and pain. She exhibits no swelling, no effusion, no crepitus, no deformity, no laceration, no spasm, normal pulse and normal strength.     Comments: Pt able to abduct her right shoulder to approx 100 degrees before significant pain.  Normal adduction.  Mild TTP along the posterior joint line.  No swelling, erythema or warmth of the joint.   Full ROM of the right elbow, wrist and hand.  Skin:    General: Skin is warm and dry.     Comments: No tenting of the skin   Neurological:     Mental Status: She is alert.     Coordination: Coordination normal.     Comments: Sensation intact to normal touch Strength 5/5 in the right upper extremity.  Pain with strength testing of the right shoulder. No pain with strength testing of the right elbow, wrist or hand.      ED Treatments / Results   Radiology Dg Shoulder Right  Result Date: 07/01/2019 CLINICAL DATA:  38 year old female with right shoulder pain. EXAM: RIGHT SHOULDER - 2+ VIEW COMPARISON:  None. FINDINGS: There is no acute fracture or dislocation. The bones are well mineralized. No significant arthritic changes. The soft tissues are unremarkable. IMPRESSION: Negative. Electronically Signed   By: Anner Crete M.D.   On: 07/01/2019 02:46    Procedures Procedures (including critical care time)  Medications Ordered in ED Medications  naproxen (NAPROSYN) tablet 500 mg (has no administration in time range)     Initial Impression / Assessment and Plan / ED Course  I have reviewed the triage vital signs and the nursing notes.  Pertinent labs & imaging results that were available during my care of the patient were reviewed by me and considered in my medical decision making (see chart for details).        Patient X-Ray negative for obvious fracture or dislocation. Pain managed in ED. Pt advised to follow up with orthopedics if symptoms persist for further evaluation of the rotator cuff.Conservative therapy recommended and discussed. Patient will be dc home & is agreeable with above plan.   Final Clinical Impressions(s) / ED Diagnoses   Final diagnoses:  Acute pain of right shoulder    ED Discharge Orders         Ordered    diclofenac sodium (VOLTAREN) 1 % GEL  4 times daily     07/01/19 0526    naproxen (NAPROSYN) 500 MG tablet  2 times daily with meals     07/01/19 0526           Ercel Pepitone, Jarrett Soho, PA-C 01/60/10 [id number]    Delora Fuel, MD 55/73/22 [phone]

## 2019-07-01 NOTE — ED Triage Notes (Signed)
Pt reports R shoulder pain that started Thursday night. 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 No swelling noted. She states that the pain is worse when she tries to raise her R arm.

## 2019-07-10 ENCOUNTER — Encounter: Payer: Self-pay | Admitting: Family Medicine

## 2019-07-10 ENCOUNTER — Ambulatory Visit (INDEPENDENT_AMBULATORY_CARE_PROVIDER_SITE_OTHER): Payer: BC Managed Care – PPO | Admitting: Family Medicine

## 2019-07-10 DIAGNOSIS — M67911 Unspecified disorder of synovium and tendon, right shoulder: Secondary | ICD-10-CM | POA: Diagnosis not present

## 2019-07-10 DIAGNOSIS — M7541 Impingement syndrome of right shoulder: Secondary | ICD-10-CM | POA: Diagnosis not present

## 2019-07-10 MED ORDER — MELOXICAM 15 MG PO TABS: 7.5000 mg | ORAL_TABLET | Freq: Every day | ORAL | 6 refills | Status: DC | PRN

## 2019-07-10 NOTE — Progress Notes (Signed)
Ashlee Booth - 38 y.o. female MRN [id number]  Date of birth: [birth year]  Office Visit Note: Visit Date: 07/10/2019 PCP: Patient, No Pcp Per Referred by: No ref. provider found  Subjective: Chief Complaint  Patient presents with  . Right Shoulder - Pain    [facility] in [facility] and [facility]. Pulling sensation in the shoulder when she tries to raise her arm.    HPI: Ashlee Residesisha Stickle is a 38 y.o. female who comes in today with right shoulder pain.  She reports that pain is over lateral and internal shoulder. Pain started two weeks ago, gradual onset. No inciting event. Works [employer], moving bags. Hurts worse with overhead arm movements. Taking naproxen for pain, using heat or ice. No shooting arm pains, numbness, tingling No prior shoulder injury or issues. No weakness.   No fevers, swelling, numbness, tingling.   ROS Otherwise per HPI.  Assessment & Plan: Visit Diagnoses:  1. Tendinopathy of rotator cuff, right   2. Shoulder impingement syndrome, right     Plan:  - restrict to light duty at work - medication as below - PT referral - return in 4-6 weeks or if no improvement. Discussed CSI if no improvement  Meds & Orders:  Meds ordered this encounter  Medications  . meloxicam (MOBIC) 15 MG tablet    Sig: Take 0.5-1 tablets (7.5-15 mg total) by mouth daily as needed for pain.    Dispense:  30 tablet    Refill:  6    Orders Placed This Encounter  Procedures  . Ambulatory referral to Physical Therapy    Follow-up: Return in about 3 weeks (around 07/31/2019).   Procedures: No procedures performed  No notes on file   Clinical History: No specialty comments available.   She reports that she has never smoked. She has never used smokeless tobacco. No results for input(s): HGBA1C, LABURIC in the last 8760 hours.  Objective:  VS:  HT:    WT:   BMI:     BP:   HR: bpm  TEMP: ( )  RESP:  Physical Exam  PHYSICAL EXAM: Gen: NAD, alert,  cooperative with exam, well-appearing HEENT: clear conjunctiva,  CV:  no edema, capillary refill brisk, normal rate Resp: non-labored Skin: no rashes, normal turgor  Neuro: no gross deficits.  Psych:  alert and oriented  Shoulder: Inspection reveals no obvious deformity, atrophy, or asymmetry. No bruising. No swelling Palpation TTP over lateral and posterior shoulder. Mild TTP over [facility] joint or bicipital groove. Limited ROM in flexion (to 75 degrees), abduction (to 75 degrees), limited  Internal rortation, normal external rotation. Pain with all movement  NV intact distally Normal scapular function observed. Special Tests:  - Impingement: Positive Hawkins, neers, empty can sign. - Supraspinatous: Positive empty can. Limited strength with resisted flexion at 20 degrees due to pain  - Infraspinatous/Teres Minor: 5/5 strength with ER - Subscapularis: negative belly press, 5/5 strength with IR - Biceps tendon: Negative Speeds, Yerrgason's  - Labrum: Positive Obriens, pain with crank good  - AC Joint: Negative cross arm - painful arc  Ortho Exam Imaging: No results found.  Past Medical/Family/Surgical/Social History: Medications & Allergies reviewed per EMR, new medications updated. There are no active problems to display for this patient.  Past Medical History:  Diagnosis Date  . Herpes genitalia    History reviewed. No pertinent family history. History reviewed. No pertinent surgical history. Social History   Occupational History  . Not  on file  Tobacco Use  . Smoking status: Never Smoker  . Smokeless tobacco: Never Used  Substance and Sexual Activity  . Alcohol use: Yes  . Drug use: No  . Sexual activity: Not on file

## 2019-07-10 NOTE — Progress Notes (Signed)
I saw and examined the patient with Dr. Mayer Masker and agree with assessment and plan as outlined.  Right shoulder impingement, probably related to repetitive lifting at [employer].  Will place on light duty restrictions for 3 weeks while doing PT and trying meloxicam.  If still not better, could try subacromial injection.

## 2019-07-12 ENCOUNTER — Telehealth: Payer: Self-pay | Admitting: Family Medicine

## 2019-07-12 NOTE — Telephone Encounter (Signed)
Patient called and stated that she was given a Rx for PT and lost it. Can't remember where or who the PT was with.  Please call patient to advise. [phone]

## 2019-07-13 NOTE — Telephone Encounter (Signed)
I called the patient and advised her we were sending her to [facility]. I told her we could just fax the referral to them and they can call her - this was fine with her.

## 2019-08-01 ENCOUNTER — Telehealth: Payer: Self-pay | Admitting: Family Medicine

## 2019-08-01 NOTE — Telephone Encounter (Signed)
Ok to give

## 2019-08-01 NOTE — Telephone Encounter (Signed)
Patient came into the clinic requesting a note from Dr. Junius Roads stating that she is okay to go back to work.  CB#[phone].  Thank you.

## 2019-08-02 NOTE — Telephone Encounter (Signed)
I called the patient. She returned to work on 07/31/19 and just needs to have a return to work letter for HR. She will pick up from the front desk later today.

## 2019-08-09 ENCOUNTER — Telehealth: Payer: Self-pay | Admitting: Family Medicine

## 2019-08-09 NOTE — Telephone Encounter (Signed)
IC,advised pt done by [employer] and gave her their number

## 2019-08-09 NOTE — Telephone Encounter (Signed)
Patient called asked if the paperwork she dropped off Monday is ready for pick up? Patient said the paperwork was her disability forms. The number to contact patient is [phone]

## 2019-08-09 NOTE — Telephone Encounter (Signed)
Have you seen these? I think they were filled out by [employer].

## 2020-07-02 ENCOUNTER — Other Ambulatory Visit: Payer: Self-pay | Admitting: Family

## 2020-07-02 DIAGNOSIS — R109 Unspecified abdominal pain: Secondary | ICD-10-CM

## 2020-07-14 ENCOUNTER — Ambulatory Visit
Admission: RE | Admit: 2020-07-14 | Discharge: 2020-07-14 | Disposition: A | Discharge status: Home / Self Care | Payer: BC Managed Care – PPO | Source: Ambulatory Visit | Attending: Family | Admitting: Family

## 2020-07-14 DIAGNOSIS — R109 Unspecified abdominal pain: Secondary | ICD-10-CM

## 2020-09-30 ENCOUNTER — Encounter: Payer: Self-pay | Admitting: Neurology

## 2020-11-20 ENCOUNTER — Other Ambulatory Visit: Payer: Self-pay

## 2020-11-20 ENCOUNTER — Encounter (HOSPITAL_BASED_OUTPATIENT_CLINIC_OR_DEPARTMENT_OTHER): Payer: Self-pay | Admitting: *Deleted

## 2020-11-20 ENCOUNTER — Emergency Department (HOSPITAL_BASED_OUTPATIENT_CLINIC_OR_DEPARTMENT_OTHER)
Admission: EM | Admit: 2020-11-20 | Discharge: 2020-11-20 | Disposition: A | Discharge status: Home / Self Care | Payer: BC Managed Care – PPO | Attending: Emergency Medicine | Admitting: Emergency Medicine

## 2020-11-20 DIAGNOSIS — R509 Fever, unspecified: Secondary | ICD-10-CM | POA: Diagnosis present

## 2020-11-20 DIAGNOSIS — U071 COVID-19: Secondary | ICD-10-CM | POA: Diagnosis not present

## 2020-11-20 LAB — CBC WITH DIFFERENTIAL/PLATELET
Abs Immature Granulocytes: 0 10*3/uL (ref 0.00–0.07)
Basophils Absolute: 0 10*3/uL (ref 0.0–0.1)
Basophils Relative: 1 %
Eosinophils Absolute: 0.1 10*3/uL (ref 0.0–0.5)
Eosinophils Relative: 3 %
HCT: 44.1 % (ref 36.0–46.0)
Hemoglobin: 14.9 g/dL (ref 12.0–15.0)
Immature Granulocytes: 0 %
Lymphocytes Relative: 44 %
Lymphs Abs: 1.7 10*3/uL (ref 0.7–4.0)
MCH: 30 pg (ref 26.0–34.0)
MCHC: 33.8 g/dL (ref 30.0–36.0)
MCV: 88.7 fL (ref 80.0–100.0)
Monocytes Absolute: 0.4 10*3/uL (ref 0.1–1.0)
Monocytes Relative: 11 %
Neutro Abs: 1.6 10*3/uL — ABNORMAL LOW (ref 1.7–7.7)
Neutrophils Relative %: 41 %
Platelets: 281 10*3/uL (ref 150–400)
RBC: 4.97 MIL/uL (ref 3.87–5.11)
RDW: 11.9 % (ref 11.5–15.5)
WBC: 3.9 10*3/uL — ABNORMAL LOW (ref 4.0–10.5)
nRBC: 0 % (ref 0.0–0.2)

## 2020-11-20 LAB — URINALYSIS, ROUTINE W REFLEX MICROSCOPIC
Bilirubin Urine: NEGATIVE
Glucose, UA: NEGATIVE mg/dL
Ketones, ur: NEGATIVE mg/dL
Nitrite: NEGATIVE
Protein, ur: NEGATIVE mg/dL
Specific Gravity, Urine: <1.005 — ABNORMAL LOW (ref 1.005–1.030)
pH: 6 (ref 5.0–8.0)

## 2020-11-20 LAB — RESP PANEL BY RT-PCR (FLU A&B, COVID) ARPGX2
Influenza A by PCR: NEGATIVE
Influenza B by PCR: NEGATIVE
SARS Coronavirus 2 by RT PCR: POSITIVE — AB

## 2020-11-20 LAB — BASIC METABOLIC PANEL
Anion gap: 7 (ref 5–15)
BUN: 8 mg/dL (ref 6–20)
CO2: 24 mmol/L (ref 22–32)
Calcium: 8.7 mg/dL — ABNORMAL LOW (ref 8.9–10.3)
Chloride: 106 mmol/L (ref 98–111)
Creatinine, Ser: 0.86 mg/dL (ref 0.44–1.00)
GFR, Estimated: >60 mL/min (ref >60)
Glucose, Bld: 115 mg/dL — ABNORMAL HIGH (ref 70–99)
Potassium: 3.6 mmol/L (ref 3.5–5.1)
Sodium: 137 mmol/L (ref 135–145)

## 2020-11-20 LAB — URINALYSIS, MICROSCOPIC (REFLEX)

## 2020-11-20 LAB — PREGNANCY, URINE: Preg Test, Ur: NEGATIVE

## 2020-11-20 MED ORDER — KETOROLAC TROMETHAMINE 15 MG/ML IJ SOLN
15.0000 mg | Freq: Once | INTRAMUSCULAR | Status: AC
  Administered 2020-11-20: 15 mg via INTRAVENOUS
  Filled 2020-11-20: qty 1

## 2020-11-20 MED ORDER — SODIUM CHLORIDE 0.9 % IV BOLUS
1000.0000 mL | Freq: Once | INTRAVENOUS | Status: AC
  Administered 2020-11-20: 1000 mL via INTRAVENOUS

## 2020-11-20 MED ORDER — METOCLOPRAMIDE HCL 5 MG/ML IJ SOLN
10.0000 mg | Freq: Once | INTRAMUSCULAR | Status: AC
  Administered 2020-11-20: 10 mg via INTRAVENOUS
  Filled 2020-11-20: qty 2

## 2020-11-20 MED ORDER — ONDANSETRON 8 MG PO TBDP: 8.0000 mg | ORAL_TABLET | Freq: Three times a day (TID) | ORAL | 0 refills | Status: DC | PRN

## 2020-11-20 MED ORDER — DIPHENHYDRAMINE HCL 50 MG/ML IJ SOLN
25.0000 mg | Freq: Once | INTRAMUSCULAR | Status: AC
  Administered 2020-11-20: 25 mg via INTRAVENOUS
  Filled 2020-11-20: qty 1

## 2020-11-20 NOTE — ED Provider Notes (Signed)
[facility] Provider Note: Ashlee Dell, MD, FACEP  CSN: [id number] MRN: [id number] ARRIVAL: 11/20/20 at 0012 ROOM: [facility]   CHIEF COMPLAINT  Covid Positive   HISTORY OF PRESENT ILLNESS  11/20/20 12:37 AM Ashlee Booth is a 39 y.o. female who developed Covid-like symptoms about 2 weeks ago.  Specifically she had fever, body aches, loss of taste and smell and general malaise.  She was feeling better 2 or 3 days ago but has acutely worsened since.  She is now having a headache, body aches notably in the chest when she coughs or takes a deep breath, malaise, generalized weakness, diarrhea and lightheadedness.  The lightheadedness occurs when standing.  She has had nausea but no vomiting.  She has had decreased oral intake particularly food.  She has not had shortness of breath or cough.   Past Medical History:  Diagnosis Date  . Herpes genitalia     Past Surgical History:  Procedure Laterality Date  . TUBAL LIGATION      No family history on file.  Social History   Tobacco Use  . Smoking status: Never Smoker  . Smokeless tobacco: Never Used  Vaping Use  . Vaping Use: Never used  Substance Use Topics  . Alcohol use: Yes  . Drug use: No    Prior to Admission medications   Medication Sig Start Date End Date Taking? Authorizing Provider  cetirizine (ZYRTEC) 10 MG tablet Take 10 mg by mouth daily.    [provider]  ondansetron (ZOFRAN ODT) 8 MG disintegrating tablet Take 1 tablet (8 mg total) by mouth every 8 (eight) hours as needed for nausea or vomiting. 11/20/20   Schneur Crowson, Jonny Ruiz, MD    Allergies Patient has no known allergies.   REVIEW OF SYSTEMS  Negative except as noted here or in the History of Present Illness.   PHYSICAL EXAMINATION  Initial Vital Signs Blood pressure (!) 147/96, pulse 77, temperature 98.1 F (36.7 C), temperature source Oral, resp. rate 16, height 5\' 7"  (1.702 m), weight 59 kg, last menstrual period 11/12/2020, SpO2  100 %.  Examination General: Well-developed, well-nourished female in no acute distress; appearance consistent with age of record HENT: normocephalic; atraumatic Eyes: pupils equal, round and reactive to light; extraocular muscles intact Neck: supple Heart: regular rate and rhythm Lungs: clear to auscultation bilaterally Abdomen: soft; nondistended; nontender; bowel sounds present Extremities: No deformity; full range of motion; pulses normal Neurologic: Awake, alert and oriented; motor function intact in all extremities and symmetric; no facial droop Skin: Warm and dry Psychiatric: Normal mood and affect   RESULTS  Summary of this visit's results, reviewed and interpreted by myself:   EKG Interpretation  Date/Time:    Ventricular Rate:    PR Interval:    QRS Duration:   QT Interval:    QTC Calculation:   R Axis:     Text Interpretation:        Laboratory Studies: Results for orders placed or performed during the hospital encounter of 11/20/20 (from the past 24 hour(s))  Urinalysis, Routine w reflex microscopic Urine, Clean Catch     Status: Abnormal   Collection Time: 11/20/20 12:39 AM  Result Value Ref Range   Color, Urine STRAW (A) YELLOW   APPearance CLEAR CLEAR   Specific Gravity, Urine <1.005 (L) 1.005 - 1.030   pH 6.0 5.0 - 8.0   Glucose, UA NEGATIVE NEGATIVE mg/dL   Hgb urine dipstick TRACE (A) NEGATIVE   Bilirubin Urine NEGATIVE NEGATIVE  Ketones, ur NEGATIVE NEGATIVE mg/dL   Protein, ur NEGATIVE NEGATIVE mg/dL   Nitrite NEGATIVE NEGATIVE   Leukocytes,Ua SMALL (A) NEGATIVE  Pregnancy, urine     Status: None   Collection Time: 11/20/20 12:39 AM  Result Value Ref Range   Preg Test, Ur NEGATIVE NEGATIVE  Urinalysis, Microscopic (reflex)     Status: Abnormal   Collection Time: 11/20/20 12:39 AM  Result Value Ref Range   RBC / HPF 6-10 0 - 5 RBC/hpf   WBC, UA 6-10 0 - 5 WBC/hpf   Bacteria, UA FEW (A) NONE SEEN   Squamous Epithelial / LPF 6-10 0 - 5  CBC  with Differential/Platelet     Status: Abnormal   Collection Time: 11/20/20 12:52 AM  Result Value Ref Range   WBC 3.9 (L) 4.0 - 10.5 K/uL   RBC 4.97 3.87 - 5.11 MIL/uL   Hemoglobin 14.9 12.0 - 15.0 g/dL   HCT 35.0 09.3 - 81.8 %   MCV 88.7 80.0 - 100.0 fL   MCH 30.0 26.0 - 34.0 pg   MCHC 33.8 30.0 - 36.0 g/dL   RDW 29.9 37.1 - 69.6 %   Platelets 281 150 - 400 K/uL   nRBC 0.0 0.0 - 0.2 %   Neutrophils Relative % 41 %   Neutro Abs 1.6 (L) 1.7 - 7.7 K/uL   Lymphocytes Relative 44 %   Lymphs Abs 1.7 0.7 - 4.0 K/uL   Monocytes Relative 11 %   Monocytes Absolute 0.4 0.1 - 1.0 K/uL   Eosinophils Relative 3 %   Eosinophils Absolute 0.1 0.0 - 0.5 K/uL   Basophils Relative 1 %   Basophils Absolute 0.0 0.0 - 0.1 K/uL   Immature Granulocytes 0 %   Abs Immature Granulocytes 0.00 0.00 - 0.07 K/uL  Basic metabolic panel     Status: Abnormal   Collection Time: 11/20/20 12:52 AM  Result Value Ref Range   Sodium 137 135 - 145 mmol/L   Potassium 3.6 3.5 - 5.1 mmol/L   Chloride 106 98 - 111 mmol/L   CO2 24 22 - 32 mmol/L   Glucose, Bld 115 (H) 70 - 99 mg/dL   BUN 8 6 - 20 mg/dL   Creatinine, Ser 7.89 0.44 - 1.00 mg/dL   Calcium 8.7 (L) 8.9 - 10.3 mg/dL   GFR, Estimated >38 >10 mL/min   Anion gap 7 5 - 15  Resp Panel by RT-PCR (Flu A&B, Covid) Nasopharyngeal Swab     Status: Abnormal   Collection Time: 11/20/20 12:52 AM   Specimen: Nasopharyngeal Swab; Nasopharyngeal(NP) swabs in vial transport medium  Result Value Ref Range   SARS Coronavirus 2 by RT PCR POSITIVE (A) NEGATIVE   Influenza A by PCR NEGATIVE NEGATIVE   Influenza B by PCR NEGATIVE NEGATIVE   Imaging Studies: No results found.  ED COURSE and MDM  Nursing notes, initial and subsequent vitals signs, including pulse oximetry, reviewed and interpreted by myself.  Vitals:   11/20/20 0019 11/20/20 0200 11/20/20 0315 11/20/20 0349  BP: (!) 147/96  108/78 108/78  Pulse: 77 73 81 80  Resp: 16 17 17 18   Temp: 98.1 F (36.7 C)   98 F (36.7 C) 98 F (36.7 C)  TempSrc: Oral     SpO2: 100% 100% 99% 99%  Weight:      Height:       Medications  sodium chloride 0.9 % bolus 1,000 mL (0 mLs Intravenous Stopped 11/20/20 0213)  diphenhydrAMINE (BENADRYL) injection 25 mg (  25 mg Intravenous Given 11/20/20 0105)  metoCLOPramide (REGLAN) injection 10 mg (10 mg Intravenous Given 11/20/20 0105)  ketorolac (TORADOL) 15 MG/ML injection 15 mg (15 mg Intravenous Given 11/20/20 0105)   3:31 AM Patient feeling better after IV fluids and medications.  She is still having some pressure around her eyes.  She was advised that she is still positive for Covid which is not unusual even after 2 weeks.  Covid symptoms can persist.  There is no specific treatment at this point as she is outside the window for the antibody infusions and is not sick enough for remdesivir (and remdesivir is not showing the clinical promise once hoped).   PROCEDURES  Procedures   ED DIAGNOSES     ICD-10-CM   1. Ongoing symptomatic disease due to COVID-19 virus  U07.1        Tyrianna Lightle, MD 11/20/20 [phone]

## 2020-11-20 NOTE — ED Triage Notes (Signed)
C/o covid + x 15 days , c/o weakness and light headed

## 2020-11-21 ENCOUNTER — Telehealth: Payer: Self-pay | Admitting: Family

## 2020-11-21 NOTE — Telephone Encounter (Signed)
Ashlee Booth chart was evaluated for possible treatment with monoclonal antibody therapy and per the notes from her recent ED visit on 12/9 she has had symptoms for 2 weeks which puts her outside the 10 day treatment window.  [name], NP 11/21/2020 4:06 PM

## 2020-11-24 ENCOUNTER — Encounter (HOSPITAL_COMMUNITY): Payer: Self-pay

## 2020-11-24 ENCOUNTER — Other Ambulatory Visit: Payer: Self-pay

## 2020-11-24 ENCOUNTER — Emergency Department (HOSPITAL_COMMUNITY)
Admission: EM | Admit: 2020-11-24 | Discharge: 2020-11-25 | Disposition: A | Discharge status: Home / Self Care | Payer: BC Managed Care – PPO | Attending: Emergency Medicine | Admitting: Emergency Medicine

## 2020-11-24 DIAGNOSIS — R197 Diarrhea, unspecified: Secondary | ICD-10-CM | POA: Insufficient documentation

## 2020-11-24 DIAGNOSIS — G43909 Migraine, unspecified, not intractable, without status migrainosus: Secondary | ICD-10-CM | POA: Diagnosis not present

## 2020-11-24 DIAGNOSIS — Z8616 Personal history of COVID-19: Secondary | ICD-10-CM | POA: Diagnosis not present

## 2020-11-24 DIAGNOSIS — Z5321 Procedure and treatment not carried out due to patient leaving prior to being seen by health care provider: Secondary | ICD-10-CM | POA: Insufficient documentation

## 2020-11-24 NOTE — ED Triage Notes (Signed)
Pt reports migraine and diarrhea since around 11/23. Pt reports testing positive for COVID on 11/27.

## 2020-11-24 NOTE — ED Notes (Signed)
PT CALLED FOR LABS, NO REPSONSE

## 2020-11-25 NOTE — ED Notes (Signed)
Pt called 3X for room placement. Eloped from waiting area.  

## 2021-01-01 ENCOUNTER — Ambulatory Visit: Payer: BC Managed Care – PPO | Admitting: Neurology

## 2021-01-02 ENCOUNTER — Ambulatory Visit: Payer: BC Managed Care – PPO | Admitting: Neurology

## 2021-03-03 ENCOUNTER — Emergency Department (HOSPITAL_BASED_OUTPATIENT_CLINIC_OR_DEPARTMENT_OTHER): Payer: BC Managed Care – PPO

## 2021-03-03 ENCOUNTER — Other Ambulatory Visit: Payer: Self-pay

## 2021-03-03 ENCOUNTER — Encounter (HOSPITAL_BASED_OUTPATIENT_CLINIC_OR_DEPARTMENT_OTHER): Payer: Self-pay | Admitting: Emergency Medicine

## 2021-03-03 ENCOUNTER — Emergency Department (HOSPITAL_BASED_OUTPATIENT_CLINIC_OR_DEPARTMENT_OTHER)
Admission: EM | Admit: 2021-03-03 | Discharge: 2021-03-03 | Disposition: A | Discharge status: Home / Self Care | Payer: BC Managed Care – PPO | Attending: Emergency Medicine | Admitting: Emergency Medicine

## 2021-03-03 DIAGNOSIS — R42 Dizziness and giddiness: Secondary | ICD-10-CM | POA: Insufficient documentation

## 2021-03-03 DIAGNOSIS — Z20822 Contact with and (suspected) exposure to covid-19: Secondary | ICD-10-CM

## 2021-03-03 DIAGNOSIS — H9311 Tinnitus, right ear: Secondary | ICD-10-CM

## 2021-03-03 DIAGNOSIS — R519 Headache, unspecified: Secondary | ICD-10-CM | POA: Diagnosis not present

## 2021-03-03 DIAGNOSIS — H9319 Tinnitus, unspecified ear: Secondary | ICD-10-CM | POA: Diagnosis not present

## 2021-03-03 LAB — SARS CORONAVIRUS 2 (TAT 6-24 HRS): SARS Coronavirus 2: NEGATIVE

## 2021-03-03 MED ORDER — MECLIZINE HCL 12.5 MG PO TABS: 12.5000 mg | ORAL_TABLET | Freq: Three times a day (TID) | ORAL | 0 refills | Status: DC | PRN

## 2021-03-03 MED ORDER — IBUPROFEN 400 MG PO TABS: 400.0000 mg | ORAL_TABLET | Freq: Four times a day (QID) | ORAL | 0 refills | Status: DC | PRN

## 2021-03-03 MED ORDER — MECLIZINE HCL 25 MG PO TABS
25.0000 mg | ORAL_TABLET | Freq: Once | ORAL | Status: AC
  Administered 2021-03-03: 25 mg via ORAL
  Filled 2021-03-03: qty 1

## 2021-03-03 MED ORDER — ACETAMINOPHEN 500 MG PO TABS
1000.0000 mg | ORAL_TABLET | Freq: Once | ORAL | Status: AC
  Administered 2021-03-03: 1000 mg via ORAL
  Filled 2021-03-03: qty 2

## 2021-03-03 NOTE — ED Triage Notes (Signed)
Patient arrived via POV c/o headache with dizziness x 3 weeks. Patient seen by PCP, placed on antibiotics and steroid with no relief. Patient is AO x 4, VS WDL, normal gait.

## 2021-03-03 NOTE — ED Provider Notes (Signed)
[facility] EMERGENCY DEPARTMENT Provider Note   CSN: [id number] Arrival date & time: 03/03/21  0548     History Chief Complaint  Patient presents with  . Dizziness    Ashlee Booth is a 40 y.o. female.   Dizziness Quality:  Room spinning Severity:  Moderate Onset quality:  Gradual Duration:  3 weeks Timing:  Constant Progression:  Unchanged Chronicity:  New Context: head movement   Relieved by:  Nothing Worsened by:  Nothing Ineffective treatments: antibiotics and steroids  Associated symptoms: headaches and tinnitus   Associated symptoms: no chest pain, no diarrhea, no hearing loss, no shortness of breath, no vomiting and no weakness   Risk factors: no hx of vertigo        Past Medical History:  Diagnosis Date  . Herpes genitalia     There are no problems to display for this patient.   Past Surgical History:  Procedure Laterality Date  . TUBAL LIGATION       OB History   No obstetric history on file.     History reviewed. No pertinent family history.  Social History   Tobacco Use  . Smoking status: Never Smoker  . Smokeless tobacco: Never Used  Vaping Use  . Vaping Use: Never used  Substance Use Topics  . Alcohol use: Yes  . Drug use: No    Home Medications Prior to Admission medications   Medication Sig Start Date End Date Taking? Authorizing Provider  amoxicillin-clavulanate (AUGMENTIN) 875-125 MG tablet Take 1 tablet by mouth 2 (two) times daily. 02/19/21   [provider]  azelastine (ASTELIN) 0.1 % nasal spray Place 2 sprays into both nostrils 2 (two) times daily. 02/19/21   [provider]  cetirizine (ZYRTEC) 10 MG tablet Take 10 mg by mouth daily.    [provider]  hydrOXYzine (ATARAX/VISTARIL) 50 MG tablet Take 50 mg by mouth at bedtime. 02/27/21   [provider]  ondansetron (ZOFRAN ODT) 8 MG disintegrating tablet Take 1 tablet (8 mg total) by mouth every 8 (eight) hours as needed for  nausea or vomiting. 11/20/20   Molpus, John, MD  predniSONE (DELTASONE) 20 MG tablet Take 40 mg by mouth daily. 02/19/21   [provider]    Allergies    Patient has no known allergies.  Review of Systems   Review of Systems  Constitutional: Negative for fever.  HENT: Positive for tinnitus. Negative for congestion and hearing loss.   Eyes: Negative for photophobia and visual disturbance.  Respiratory: Negative for shortness of breath.   Cardiovascular: Negative for chest pain.  Gastrointestinal: Negative for diarrhea and vomiting.  Genitourinary: Negative for difficulty urinating.  Musculoskeletal: Negative for arthralgias.  Skin: Negative for rash.  Neurological: Positive for dizziness and headaches. Negative for weakness.  Psychiatric/Behavioral: Negative for agitation.  All other systems reviewed and are negative.   Physical Exam Updated Vital Signs BP (!) 126/94 (BP Location: Right Arm)   Pulse 83   Temp 99.8 F (37.7 C) (Oral)   Resp 19   Ht 5\' 8"  (1.727 m)   Wt 68 kg   LMP 03/03/2021 (Exact Date)   SpO2 99%   BMI 22.81 kg/m   Physical Exam Vitals and nursing note reviewed.  Constitutional:      Appearance: Normal appearance. She is not ill-appearing.  HENT:     Head: Normocephalic and atraumatic.     Right Ear: Tympanic membrane and ear canal normal.     Left Ear: Tympanic membrane  and ear canal normal.     Nose: Nose normal.  Eyes:     Extraocular Movements: Extraocular movements intact.     Conjunctiva/sclera: Conjunctivae normal.     Pupils: Pupils are equal, round, and reactive to light.     Comments: Cognition intact no proptosis disk sharp   Cardiovascular:     Rate and Rhythm: Normal rate and regular rhythm.     Pulses: Normal pulses.     Heart sounds: Normal heart sounds.  Pulmonary:     Effort: Pulmonary effort is normal.     Breath sounds: Normal breath sounds.  Abdominal:     General: Abdomen is flat. Bowel sounds are normal.      Palpations: Abdomen is soft.     Tenderness: There is no abdominal tenderness. There is no guarding.  Musculoskeletal:        General: Normal range of motion.     Cervical back: Normal range of motion and neck supple.  Skin:    General: Skin is warm and dry.     Capillary Refill: Capillary refill takes less than 2 seconds.  Neurological:     General: No focal deficit present.     Mental Status: She is alert and oriented to person, place, and time.     Cranial Nerves: No cranial nerve deficit.     Deep Tendon Reflexes: Reflexes normal.  Psychiatric:        Mood and Affect: Mood normal.        Behavior: Behavior normal.     ED Results / Procedures / Treatments   Labs (all labs ordered are listed, but only abnormal results are displayed) Labs Reviewed - No data to display  EKG None  Radiology No results found.  Procedures Procedures   Medications Ordered in ED Medications  meclizine (ANTIVERT) tablet 25 mg (25 mg Oral Given 03/03/21 0605)    ED Course  I have reviewed the triage vital signs and the nursing notes.  Pertinent labs & imaging results that were available during my care of the patient were reviewed by me and considered in my medical decision making (see chart for details).     Final Clinical Impression(s) / ED Diagnoses Return for intractable cough, coughing up blood, fevers >100.4 unrelieved by medication, shortness of breath, intractable vomiting, chest pain, shortness of breath, weakness, numbness, changes in speech, facial asymmetry, abdominal pain, passing out, Inability to tolerate liquids or food, cough, altered mental status or any concerns. No signs of systemic illness or infection. The patient is nontoxic-appearing on exam and vital signs are within normal limits.  I have reviewed the triage vital signs and the nursing notes. Pertinent labs & imaging results that were available during my care of the patient were reviewed by me and considered in my  medical decision making (see chart for details). After history, exam, and medical workup I feel the patient has been appropriately medically screened and is safe for discharge home. Pertinent diagnoses were discussed with the patient. Patient was given return precautions.      Palumbo, April, MD 03/03/21 [phone]

## 2021-03-12 ENCOUNTER — Other Ambulatory Visit: Payer: Self-pay

## 2021-03-12 ENCOUNTER — Encounter (HOSPITAL_BASED_OUTPATIENT_CLINIC_OR_DEPARTMENT_OTHER): Payer: Self-pay

## 2021-03-12 ENCOUNTER — Emergency Department (HOSPITAL_BASED_OUTPATIENT_CLINIC_OR_DEPARTMENT_OTHER)
Admission: EM | Admit: 2021-03-12 | Discharge: 2021-03-12 | Disposition: A | Discharge status: Home / Self Care | Payer: BC Managed Care – PPO | Attending: Emergency Medicine | Admitting: Emergency Medicine

## 2021-03-12 DIAGNOSIS — R202 Paresthesia of skin: Secondary | ICD-10-CM | POA: Insufficient documentation

## 2021-03-12 DIAGNOSIS — M2669 Other specified disorders of temporomandibular joint: Secondary | ICD-10-CM | POA: Insufficient documentation

## 2021-03-12 DIAGNOSIS — R1032 Left lower quadrant pain: Secondary | ICD-10-CM | POA: Insufficient documentation

## 2021-03-12 DIAGNOSIS — H9201 Otalgia, right ear: Secondary | ICD-10-CM | POA: Insufficient documentation

## 2021-03-12 LAB — CBC
HCT: 43.5 % (ref 36.0–46.0)
Hemoglobin: 14.9 g/dL (ref 12.0–15.0)
MCH: 30.6 pg (ref 26.0–34.0)
MCHC: 34.3 g/dL (ref 30.0–36.0)
MCV: 89.3 fL (ref 80.0–100.0)
Platelets: 230 10*3/uL (ref 150–400)
RBC: 4.87 MIL/uL (ref 3.87–5.11)
RDW: 11.9 % (ref 11.5–15.5)
WBC: 4.7 10*3/uL (ref 4.0–10.5)
nRBC: 0 % (ref 0.0–0.2)

## 2021-03-12 LAB — URINALYSIS, ROUTINE W REFLEX MICROSCOPIC
Bilirubin Urine: NEGATIVE
Glucose, UA: NEGATIVE mg/dL
Ketones, ur: NEGATIVE mg/dL
Leukocytes,Ua: NEGATIVE
Nitrite: NEGATIVE
Protein, ur: NEGATIVE mg/dL
Specific Gravity, Urine: 1.015 (ref 1.005–1.030)
pH: 8 (ref 5.0–8.0)

## 2021-03-12 LAB — URINALYSIS, MICROSCOPIC (REFLEX)

## 2021-03-12 LAB — LIPASE, BLOOD: Lipase: 36 U/L (ref 11–51)

## 2021-03-12 LAB — COMPREHENSIVE METABOLIC PANEL
ALT: 15 U/L (ref 0–44)
AST: 19 U/L (ref 15–41)
Albumin: 4.2 g/dL (ref 3.5–5.0)
Alkaline Phosphatase: 48 U/L (ref 38–126)
Anion gap: 9 (ref 5–15)
BUN: 14 mg/dL (ref 6–20)
CO2: 26 mmol/L (ref 22–32)
Calcium: 9.5 mg/dL (ref 8.9–10.3)
Chloride: 101 mmol/L (ref 98–111)
Creatinine, Ser: 0.81 mg/dL (ref 0.44–1.00)
GFR, Estimated: >60 mL/min (ref >60)
Glucose, Bld: 113 mg/dL — ABNORMAL HIGH (ref 70–99)
Potassium: 3.7 mmol/L (ref 3.5–5.1)
Sodium: 136 mmol/L (ref 135–145)
Total Bilirubin: 0.3 mg/dL (ref 0.3–1.2)
Total Protein: 6.9 g/dL (ref 6.5–8.1)

## 2021-03-12 LAB — PREGNANCY, URINE: Preg Test, Ur: NEGATIVE

## 2021-03-12 MED ORDER — DEXAMETHASONE SODIUM PHOSPHATE 10 MG/ML IJ SOLN
10.0000 mg | Freq: Once | INTRAMUSCULAR | Status: DC
  Filled 2021-03-12: qty 1

## 2021-03-12 MED ORDER — PREDNISONE 10 MG (21) PO TBPK: ORAL_TABLET | ORAL | 0 refills | Status: DC

## 2021-03-12 MED ORDER — DEXAMETHASONE SODIUM PHOSPHATE 10 MG/ML IJ SOLN
10.0000 mg | Freq: Once | INTRAMUSCULAR | Status: AC
  Administered 2021-03-12: 10 mg via INTRAVENOUS

## 2021-03-12 NOTE — ED Provider Notes (Signed)
[facility] EMERGENCY DEPARTMENT Provider Note   CSN: [id number] Arrival date & time: 03/12/21  1217     History Chief Complaint  Patient presents with  . Abdominal Pain    Ashlee Booth is a 40 y.o. female.  Pt presents to the ED today with abdominal pain, numbness in arms, and legs.  Pt has had the numbness for about a month.  She had some llq abd pain last night, but that is better now.  Pt feels the numbness is more worrisome for her because it keeps her awake.  She does work for [employer] and is lifting heavy things all day long.  Pt has not had f/c.        Past Medical History:  Diagnosis Date  . Herpes genitalia     There are no problems to display for this patient.   Past Surgical History:  Procedure Laterality Date  . TUBAL LIGATION       OB History   No obstetric history on file.     No family history on file.  Social History   Tobacco Use  . Smoking status: Never Smoker  . Smokeless tobacco: Never Used  Vaping Use  . Vaping Use: Never used  Substance Use Topics  . Alcohol use: Yes  . Drug use: No    Home Medications Prior to Admission medications   Medication Sig Start Date End Date Taking? Authorizing Provider  predniSONE (STERAPRED UNI-PAK 21 TAB) 10 MG (21) TBPK tablet Take 6 tabs for 2 days, then 5 for 2 days, then 4 for 2 days, then 3 for 2 days, 2 for 2 days, then 1 for 2 days 03/12/21  Yes Jacalyn Lefevre, MD  amoxicillin-clavulanate (AUGMENTIN) 875-125 MG tablet Take 1 tablet by mouth 2 (two) times daily. 02/19/21   [provider]  azelastine (ASTELIN) 0.1 % nasal spray Place 2 sprays into both nostrils 2 (two) times daily. 02/19/21   [provider]  cetirizine (ZYRTEC) 10 MG tablet Take 10 mg by mouth daily.    [provider]  hydrOXYzine (ATARAX/VISTARIL) 50 MG tablet Take 50 mg by mouth at bedtime. 02/27/21   [provider]  ibuprofen (ADVIL) 400 MG tablet Take 1 tablet (400 mg total) by mouth  every 6 (six) hours as needed. 03/03/21   Palumbo, April, MD  meclizine (ANTIVERT) 12.5 MG tablet Take 1 tablet (12.5 mg total) by mouth 3 (three) times daily as needed for dizziness. 03/03/21   Palumbo, April, MD  ondansetron (ZOFRAN ODT) 8 MG disintegrating tablet Take 1 tablet (8 mg total) by mouth every 8 (eight) hours as needed for nausea or vomiting. 11/20/20   Molpus, Jonny Ruiz, MD    Allergies    Patient has no known allergies.  Review of Systems   Review of Systems  Gastrointestinal: Positive for abdominal pain.  Neurological: Positive for numbness.  All other systems reviewed and are negative.   Physical Exam Updated Vital Signs BP 134/76   Pulse 74   Temp 98.5 F (36.9 C)   Resp 16   Ht 5\' 8"  (1.727 m)   Wt 68 kg   LMP 03/03/2021 (Exact Date)   SpO2 98%   BMI 22.81 kg/m   Physical Exam Vitals and nursing note reviewed.  Constitutional:      Appearance: She is well-developed.  HENT:     Head: Normocephalic and atraumatic.     Mouth/Throat:     Mouth: Mucous membranes are moist.  Pharynx: Oropharynx is clear.  Eyes:     Extraocular Movements: Extraocular movements intact.     Pupils: Pupils are equal, round, and reactive to light.  Cardiovascular:     Rate and Rhythm: Normal rate and regular rhythm.  Abdominal:     General: Bowel sounds are normal.     Palpations: Abdomen is soft.  Skin:    General: Skin is warm.     Capillary Refill: Capillary refill takes less than 2 seconds.  Neurological:     General: No focal deficit present.     Mental Status: She is alert and oriented to person, place, and time.  Psychiatric:        Mood and Affect: Mood normal.        Behavior: Behavior normal.     ED Results / Procedures / Treatments   Labs (all labs ordered are listed, but only abnormal results are displayed) Labs Reviewed  COMPREHENSIVE METABOLIC PANEL - Abnormal; Notable for the following components:      Result Value   Glucose, Bld 113 (*)    All other  components within normal limits  URINALYSIS, ROUTINE W REFLEX MICROSCOPIC - Abnormal; Notable for the following components:   Hgb urine dipstick TRACE (*)    All other components within normal limits  URINALYSIS, MICROSCOPIC (REFLEX) - Abnormal; Notable for the following components:   Bacteria, UA RARE (*)    All other components within normal limits  LIPASE, BLOOD  CBC  PREGNANCY, URINE    EKG EKG Interpretation  Date/Time:  Thursday March 12 2021 12:48:11 EDT Ventricular Rate:  74 PR Interval:  120 QRS Duration: 84 QT Interval:  386 QTC Calculation: 428 R Axis:   51 Text Interpretation: Normal sinus rhythm Normal ECG Confirmed by Jacalyn Lefevre [phone]) on 03/12/2021 12:51:06 PM   Radiology No results found.  Procedures Procedures   Medications Ordered in ED Medications  dexamethasone (DECADRON) injection 10 mg (10 mg Intramuscular Not Given 03/12/21 1334)  dexamethasone (DECADRON) injection 10 mg (10 mg Intravenous Given 03/12/21 1344)    ED Course  I have reviewed the triage vital signs and the nursing notes.  Pertinent labs & imaging results that were available during my care of the patient were reviewed by me and considered in my medical decision making (see chart for details).    MDM Rules/Calculators/A&P                         Labs nl.  abd pain has improved.  Paresthesias possibly due to a pinched nerve from her work.  She is given decadron in the ed and is d/c with prednisone.  She is to f/u with pcp and with sports medicine.  Return if worse.  Final Clinical Impression(s) / ED Diagnoses Final diagnoses:  Left lower quadrant abdominal pain  Paresthesias    Rx / DC Orders ED Discharge Orders         Ordered    predniSONE (STERAPRED UNI-PAK 21 TAB) 10 MG (21) TBPK tablet        03/12/21 1355           Jacalyn Lefevre, MD 03/12/21 1418

## 2021-03-12 NOTE — ED Triage Notes (Signed)
Pt c/o L side abd pain, L shoulder pain, and numbness x 4 extremities. Pt has associated nausea and diarrhea.

## 2021-03-16 ENCOUNTER — Encounter: Payer: Self-pay | Admitting: Family Medicine

## 2021-03-16 ENCOUNTER — Other Ambulatory Visit: Payer: Self-pay

## 2021-03-16 ENCOUNTER — Ambulatory Visit: Payer: Self-pay

## 2021-03-16 ENCOUNTER — Ambulatory Visit (INDEPENDENT_AMBULATORY_CARE_PROVIDER_SITE_OTHER): Payer: BC Managed Care – PPO | Admitting: Family Medicine

## 2021-03-16 DIAGNOSIS — G8929 Other chronic pain: Secondary | ICD-10-CM

## 2021-03-16 DIAGNOSIS — M25562 Pain in left knee: Secondary | ICD-10-CM | POA: Diagnosis not present

## 2021-03-16 NOTE — Progress Notes (Signed)
I saw and examined the patient with Dr. Marga Hoots and agree with assessment and plan as outlined.    Patellofemoral pain with maltracking.  Will try home exercises.  Did not tolerate PSO brace in past.  Will do PT if not improving.  Glucosamine recommended.

## 2021-03-16 NOTE — Patient Instructions (Signed)
   Glucosamine Sulfate:  1,000 mg twice daily    

## 2021-03-16 NOTE — Progress Notes (Signed)
Office Visit Note   Patient: Ashlee Booth           Date of Birth: 1981-04-20           MRN: [id number] Visit Date: 03/16/2021 Requested by: Vonna Kotyk, NP [address] [place],  [place] [address] PCP: Vonna Kotyk, NP  Subjective: Chief Complaint  Patient presents with  . Left Knee - Pain    Achiness and stiffness in the knee (anterior) x years, but worse over the past year. Years ago, she was just walking and fell into an open man-hole, hitting the left knee against the edge of the hole. She was evaluated and did not fracture the patella, nor did she need surgery. The knee "wants to lock up" at times. No swelling.     HPI: 40yo F presenting to clinic with chronic anterior knee pain. Patient states pain initially started when she feel into a man-whole several years ago, and struck directly against her patella. She felt fine for awhile after that initial recovery, but lately she's been having diffuse anterior knee pain, which is worse with walking, prolonged sitting. She says she works at a job where she is on her feet throughout the day, and notices her pain worsen over the course of the day. No catching/locking, and no feelings of instability. She denies any significant swelling of the knee. Does state her knee will occasionally 'pop,' which can be uncomfortable.               ROS:   All other systems were reviewed and are negative.  Objective: Vital Signs: LMP 03/03/2021 (Exact Date)   Physical Exam:  General:  Alert and oriented, in no acute distress. Pulm:  Breathing unlabored. Psy:  Normal mood, congruent affect. Skin:  Bilateral knees overlying skin intact. No swelling, bruising, rashes, or erythema.   Right Knee Exam:  General: Normal gait Standing exam: Very mild genu valgus with standing, with increased Q angle. No pes planus or pes cavus.   Seated Exam:  No patellar crepitus, Negative J-Sign.   Palpation: Endorses tenderness to palpation over  both medial and lateral patellar facets and with patellar compression. No tenderness to palpation over medial or lateral joint lines.   Supine exam: No effusion, normal patellar mobility. Mildly decreased VMO Bulk compared to lateral quads.  Ligamentous Exam:  No pain or laxity with anterior/posterior drawer.  No obvious Sag.  No pain or laxity with varus/valgus stress across the knee.   Meniscus:  McMurray with no pain or deep clicking.  Thessaly negative.   Strength: Hip flexion (L1), Hip Aduction (L2), Knee Extension (L3) are 5/5 Bilaterally Foot Inversion (L4), Dorsiflexion (L5), and Eversion (S1) 5/5 Bilaterally  Sensation: Intact to light touch medial and lateral aspects of lower extremities, and lateral, dorsal, and medial aspects of foot.    Imaging: Knee AP, Lateral  Joint space well preserved, though does have trace spurring over medial joint space, suggestive of very early degenerative changes. Bony spurring appreciated at quadriceps and patellar tendon insertion at superior and inferior aspect of patella, though patellar articulation surface itself appears smooth, with no evidence of degenerative changes.  No acute bony abnormalities.    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 Suspect this is likely due to combination of biomechanical factors, with mildly increased Q angle of the hip, as well as weakened VMO musculature.  - Given HEP  to help strengthen VMO and pull kneecap medially to improve tracking - Discussed knee bracing. Patient states she has already bought braces OTC - If no improvement with HEP, could consider formal PT - Patient expresses understanding with plan. She has no further questions or concerns today.      Procedures: No procedures performed        PMFS History: Patient Active Problem List   Diagnosis Date Noted  . Referred otalgia, right 03/12/2021   . 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 Herpes genitalia     No family history on file.  Past Surgical History:  Procedure Laterality Date  . TUBAL LIGATION     Social History   Occupational History  . Not on file  Tobacco Use  . Smoking status: Never Smoker  . Smokeless tobacco: Never Used  Vaping Use  . Vaping Use: Never used  Substance and Sexual Activity  . Alcohol use: Yes  . Drug use: No  . Sexual activity: Not on file

## 2021-03-24 ENCOUNTER — Encounter: Payer: Self-pay | Admitting: Neurology

## 2021-03-29 ENCOUNTER — Emergency Department (HOSPITAL_BASED_OUTPATIENT_CLINIC_OR_DEPARTMENT_OTHER)
Admission: EM | Admit: 2021-03-29 | Discharge: 2021-03-30 | Disposition: A | Discharge status: Home / Self Care | Payer: BC Managed Care – PPO | Attending: Emergency Medicine | Admitting: Emergency Medicine

## 2021-03-29 ENCOUNTER — Other Ambulatory Visit: Payer: Self-pay

## 2021-03-29 ENCOUNTER — Encounter (HOSPITAL_BASED_OUTPATIENT_CLINIC_OR_DEPARTMENT_OTHER): Payer: Self-pay | Admitting: *Deleted

## 2021-03-29 ENCOUNTER — Emergency Department (HOSPITAL_BASED_OUTPATIENT_CLINIC_OR_DEPARTMENT_OTHER): Payer: BC Managed Care – PPO

## 2021-03-29 DIAGNOSIS — K529 Noninfective gastroenteritis and colitis, unspecified: Secondary | ICD-10-CM | POA: Insufficient documentation

## 2021-03-29 DIAGNOSIS — M542 Cervicalgia: Secondary | ICD-10-CM | POA: Insufficient documentation

## 2021-03-29 DIAGNOSIS — R109 Unspecified abdominal pain: Secondary | ICD-10-CM | POA: Diagnosis present

## 2021-03-29 DIAGNOSIS — R519 Headache, unspecified: Secondary | ICD-10-CM | POA: Insufficient documentation

## 2021-03-29 LAB — CBC WITH DIFFERENTIAL/PLATELET
Abs Immature Granulocytes: 0.03 K/uL (ref 0.00–0.07)
Basophils Absolute: 0 K/uL (ref 0.0–0.1)
Basophils Relative: 1 %
Eosinophils Absolute: 0.2 K/uL (ref 0.0–0.5)
Eosinophils Relative: 3 %
HCT: 45.4 % (ref 36.0–46.0)
Hemoglobin: 15.4 g/dL — ABNORMAL HIGH (ref 12.0–15.0)
Immature Granulocytes: 0 %
Lymphocytes Relative: 37 %
Lymphs Abs: 2.6 K/uL (ref 0.7–4.0)
MCH: 30.5 pg (ref 26.0–34.0)
MCHC: 33.9 g/dL (ref 30.0–36.0)
MCV: 89.9 fL (ref 80.0–100.0)
Monocytes Absolute: 0.6 K/uL (ref 0.1–1.0)
Monocytes Relative: 9 %
Neutro Abs: 3.6 K/uL (ref 1.7–7.7)
Neutrophils Relative %: 50 %
Platelets: 251 K/uL (ref 150–400)
RBC: 5.05 MIL/uL (ref 3.87–5.11)
RDW: 12.3 % (ref 11.5–15.5)
WBC: 7.1 K/uL (ref 4.0–10.5)
nRBC: 0 % (ref 0.0–0.2)

## 2021-03-29 LAB — LIPASE, BLOOD: Lipase: 30 U/L (ref 11–51)

## 2021-03-29 LAB — COMPREHENSIVE METABOLIC PANEL
ALT: 19 U/L (ref 0–44)
AST: 20 U/L (ref 15–41)
Albumin: 4.3 g/dL (ref 3.5–5.0)
Alkaline Phosphatase: 42 U/L (ref 38–126)
Anion gap: 10 (ref 5–15)
BUN: 11 mg/dL (ref 6–20)
CO2: 24 mmol/L (ref 22–32)
Calcium: 9 mg/dL (ref 8.9–10.3)
Chloride: 102 mmol/L (ref 98–111)
Creatinine, Ser: 0.85 mg/dL (ref 0.44–1.00)
GFR, Estimated: >60 mL/min (ref >60)
Glucose, Bld: 93 mg/dL (ref 70–99)
Potassium: 3.2 mmol/L — ABNORMAL LOW (ref 3.5–5.1)
Sodium: 136 mmol/L (ref 135–145)
Total Bilirubin: 0.5 mg/dL (ref 0.3–1.2)
Total Protein: 7.3 g/dL (ref 6.5–8.1)

## 2021-03-29 LAB — URINALYSIS, ROUTINE W REFLEX MICROSCOPIC
Bilirubin Urine: NEGATIVE
Glucose, UA: NEGATIVE mg/dL
Ketones, ur: NEGATIVE mg/dL
Leukocytes,Ua: NEGATIVE
Nitrite: NEGATIVE
Protein, ur: NEGATIVE mg/dL
Specific Gravity, Urine: <1.005 — ABNORMAL LOW (ref 1.005–1.030)
pH: 6 (ref 5.0–8.0)

## 2021-03-29 LAB — PREGNANCY, URINE: Preg Test, Ur: NEGATIVE

## 2021-03-29 LAB — URINALYSIS, MICROSCOPIC (REFLEX)

## 2021-03-29 MED ORDER — IOHEXOL 300 MG/ML  SOLN
100.0000 mL | Freq: Once | INTRAMUSCULAR | Status: AC | PRN
  Administered 2021-03-29: 100 mL via INTRAVENOUS

## 2021-03-29 MED ORDER — POTASSIUM CHLORIDE CRYS ER 20 MEQ PO TBCR
20.0000 meq | EXTENDED_RELEASE_TABLET | Freq: Once | ORAL | Status: AC
  Administered 2021-03-30: 20 meq via ORAL
  Filled 2021-03-29: qty 1

## 2021-03-29 MED ORDER — SODIUM CHLORIDE 0.9 % IV BOLUS
1000.0000 mL | Freq: Once | INTRAVENOUS | Status: AC
  Administered 2021-03-29: 1000 mL via INTRAVENOUS

## 2021-03-29 MED ORDER — METOCLOPRAMIDE HCL 5 MG/ML IJ SOLN
10.0000 mg | Freq: Once | INTRAMUSCULAR | Status: AC
  Administered 2021-03-29: 10 mg via INTRAVENOUS
  Filled 2021-03-29: qty 2

## 2021-03-29 MED ORDER — MORPHINE SULFATE (PF) 4 MG/ML IV SOLN
4.0000 mg | Freq: Once | INTRAVENOUS | Status: AC
Start: 2021-03-29 — End: 2021-03-29
  Administered 2021-03-29: 4 mg via INTRAVENOUS
  Filled 2021-03-29: qty 1

## 2021-03-29 NOTE — ED Provider Notes (Signed)
[facility] EMERGENCY DEPARTMENT Provider Note   CSN: [id number] Arrival date & time: 03/29/21  3009     History Chief Complaint  Patient presents with  . Abdominal Pain    Ashlee Booth is a 40 y.o. female no pertinent past medical history.  Patient presents with chief complaint of left-sided abdominal pain.  Patient reports pain has been ongoing over the last week.  Patient describes her pain as a constant "discomfort," with intermittent sharp pains.  Patient rates her pain 7/10 on the pain scale.  Patient denies any radiation of the pain.  Patient denies any aggravating or alleviating factors.  Pain has not changed over the last week.  Patient denies any associated nausea, vomiting, fevers, chills, constipation, diarrhea, dysuria, hematuria, urinary frequency, vaginal bleeding, vaginal discharge, vaginal pain.  She also complains of right-sided neck pain.  Patient reports her pain started 2 days prior.  Patient denies any recent trauma or injuries.  Patient rates her pain 7/10 on the pain scale.  Patient endorses associated headache.  Pain of her headache is located in occipital region.  Patient reports headache was gradual in onset.  Pain was not maximal in onset.  Patient denies any numbness or tingling to extremities, weakness to extremities, facial asymmetry, focal neurological deficit, slurred speech, decreased range of motion, neck stiffness.    Last menstrual period 3/22.  Patient is sexually active with one female partner.  HPI     Past Medical History:  Diagnosis Date  . Herpes genitalia     Patient Active Problem List   Diagnosis Date Noted  . Referred otalgia, right 03/12/2021  . 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 TUBAL LIGATION       OB History   No obstetric history on file.     No family history on file.  Social History   Tobacco Use  . Smoking status: Never Smoker  . Smokeless  tobacco: Never Used  Vaping Use  . Vaping Use: Never used  Substance Use Topics  . Alcohol use: Yes  . Drug use: No    Home Medications Prior to Admission medications   Medication Sig Start Date End Date Taking? Authorizing Provider  busPIRone (BUSPAR) 10 MG tablet Take by mouth. 03/09/21   [provider]  ibuprofen (ADVIL) 400 MG tablet Take 1 tablet (400 mg total) by mouth every 6 (six) hours as needed. 03/03/21   Palumbo, April, MD  predniSONE (STERAPRED UNI-PAK 21 TAB) 10 MG (21) TBPK tablet Take 6 tabs for 2 days, then 5 for 2 days, then 4 for 2 days, then 3 for 2 days, 2 for 2 days, then 1 for 2 days 03/12/21   Jacalyn Lefevre, MD    Allergies    Patient has no known allergies.  Review of Systems   Review of Systems  Constitutional: Negative for chills and fever.  Eyes: Negative for visual disturbance.  Respiratory: Negative for cough and shortness of breath.   Cardiovascular: Negative for chest pain.  Gastrointestinal: Positive for abdominal pain. Negative for abdominal distention, anal bleeding, blood in stool, constipation, diarrhea, nausea, rectal pain and vomiting.  Genitourinary: Negative for decreased urine volume, difficulty urinating, dysuria, flank pain, frequency, genital sores, hematuria, pelvic pain, urgency, vaginal bleeding, vaginal discharge and vaginal pain.  Musculoskeletal: Positive for neck pain. Negative for back pain, myalgias and neck stiffness.  Skin: Negative for color change and rash.  Neurological: Positive for headaches. Negative  for dizziness, tremors, seizures, syncope, facial asymmetry, speech difficulty, weakness, light-headedness and numbness.  Psychiatric/Behavioral: Negative for confusion.    Physical Exam Updated Vital Signs BP 120/83 (BP Location: Right Arm)   Pulse 69   Temp 99.6 F (37.6 C) (Oral)   Resp 12   Ht 5\' 8"  (1.727 m)   Wt 68 kg   LMP 03/03/2021 (Exact Date)   SpO2 100%   BMI 22.81 kg/m   Physical  Exam Vitals and nursing note reviewed.  Constitutional:      General: She is not in acute distress.    Appearance: She is not ill-appearing, toxic-appearing or diaphoretic.  HENT:     Head: Normocephalic.     Jaw: No trismus or pain on movement.     Mouth/Throat:     Mouth: Mucous membranes are moist.     Pharynx: Oropharynx is clear. Uvula midline. No pharyngeal swelling, oropharyngeal exudate, posterior oropharyngeal erythema or uvula swelling.  Eyes:     General: No scleral icterus.       Right eye: No discharge.        Left eye: No discharge.  Cardiovascular:     Rate and Rhythm: Normal rate.  Pulmonary:     Effort: Pulmonary effort is normal. No tachypnea, bradypnea or prolonged expiration.     Breath sounds: Normal breath sounds. No stridor.  Abdominal:     General: Abdomen is flat. Bowel sounds are normal. There is no distension. There are no signs of injury.     Palpations: Abdomen is soft. There is no mass or pulsatile mass.     Tenderness: There is abdominal tenderness in the left upper quadrant and left lower quadrant. There is no right CVA tenderness, left CVA tenderness, guarding or rebound. Negative signs include McBurney's sign.     Hernia: There is no hernia in the umbilical area or ventral area.  Musculoskeletal:     Cervical back: Normal range of motion and neck supple. No edema, erythema, signs of trauma, rigidity, torticollis or crepitus. No pain with movement, spinous process tenderness or muscular tenderness. Normal range of motion.  Lymphadenopathy:     Cervical: No cervical adenopathy.  Skin:    General: Skin is warm and dry.  Neurological:     General: No focal deficit present.     Mental Status: She is alert.     GCS: GCS eye subscore is 4. GCS verbal subscore is 5. GCS motor subscore is 6.     Cranial Nerves: No facial asymmetry.     Comments: CN II through XII intact.  Patient moves all limbs equally without difficulty  Psychiatric:        Behavior:  Behavior is cooperative.     ED Results / Procedures / Treatments   Labs (all labs ordered are listed, but only abnormal results are displayed) Labs Reviewed  COMPREHENSIVE METABOLIC PANEL - Abnormal; Notable for the following components:      Result Value   Potassium 3.2 (*)    All other components within normal limits  CBC WITH DIFFERENTIAL/PLATELET - Abnormal; Notable for the following components:   Hemoglobin 15.4 (*)    All other components within normal limits  URINALYSIS, ROUTINE W REFLEX MICROSCOPIC - Abnormal; Notable for the following components:   Color, Urine STRAW (*)    Specific Gravity, Urine <1.005 (*)    Hgb urine dipstick SMALL (*)    All other components within normal limits  URINALYSIS, MICROSCOPIC (REFLEX) - Abnormal; Notable for  the following components:   Bacteria, UA FEW (*)    Non Squamous Epithelial PRESENT (*)    All other components within normal limits  LIPASE, BLOOD  PREGNANCY, URINE    EKG None  Radiology CT Abdomen Pelvis W Contrast  Result Date: 03/29/2021 CLINICAL DATA:  Left upper quadrant pain EXAM: CT ABDOMEN AND PELVIS WITH CONTRAST TECHNIQUE: Multidetector CT imaging of the abdomen and pelvis was performed using the standard protocol following bolus administration of intravenous contrast. CONTRAST:  100mL OMNIPAQUE IOHEXOL 300 MG/ML  SOLN COMPARISON:  Renal ultrasound 07/14/2020, CT 03/20/2019 FINDINGS: Lower chest: Lung bases are clear. Normal heart size. No pericardial effusion. Hepatobiliary: Multiple fluid attenuation hepatic cysts are similar to minimally increased in size from comparison. Largest in the left lobe posteriorly measuring 2.6 cm, previously 2.2 cm (2/17); largest in the right lobe measuring 1.8 cm, previously 1 cm (2/18). No new concerning focal liver lesions. Normal gallbladder and biliary tree. Normal liver attenuation smooth surface contour. Pancreas: No pancreatic ductal dilatation or surrounding inflammatory changes.  Spleen: Normal in size. No concerning splenic lesions. Adrenals/Urinary Tract: Normal adrenals. Kidneys are normally located with symmetric enhancement. No suspicious renal lesion, urolithiasis or hydronephrosis. Urinary bladder is unremarkable Stomach/Bowel: Distal esophagus is free of acute abnormality. Some mild thickening and rugal fold hyperemia noted in the mid stomach is nonspecific, possibly related to peristalsis though a gastritis could present similarly. Distal stomach and duodenum are unremarkable. No small bowel thickening or dilatation. Normal appendix in the right lower quadrant coiling in a retrocecal position without periappendiceal inflammation. Proximal colon is fairly unremarkable in appearance. There is extensive distal colonic diverticulosis extending from the splenic flexure through the rectosigmoid. There is some mild colonic wall thickening involving the splenic flexure and proximal descending colon greater than seen for the more distal decompressed adjacent loops, with some associated increased vascularity which could reflect an acute colitis. No evidence of obstruction. Vascular/Lymphatic: No significant vascular findings are present. No enlarged abdominal or pelvic lymph nodes. Reproductive: Anteverted uterus. Redemonstration of an anterior fundal fibroid measuring up to 2 cm in size (previously 1.8 cm. No concerning adnexal lesions. Other: No abdominopelvic free fluid or free gas. No bowel containing hernias. Musculoskeletal: No acute osseous abnormality or suspicious osseous lesion. IMPRESSION: 1. Mild colonic wall thickening involving the splenic flexure and proximal descending colon greater than seen for the more distal decompressed loops, with some associated increased vascularity which could reflect an acute colitis in the provided clinical setting. While there are numerous colonic diverticula including within the segment, acute diverticulitis is less favored in the absence of a  focal culprit diverticulum. Consider outpatient direct visualization as clinically warranted. 2. Mild thickening and rugal fold hyperemia in the mid stomach is nonspecific, possibly related to peristalsis though a gastritis could present similarly. 3. Redemonstration of numerous hepatic cyst, stable to slightly increased in size from prior without aggressive or worrisome features. 4. Intramural fundal fibroid within the uterus. Electronically Signed   By: Kreg ShropshirePrice  DeHay M.D.   On: 03/29/2021 22:26    Procedures Procedures   Medications Ordered in ED Medications  metoCLOPramide (REGLAN) injection 10 mg (has no administration in time range)  sodium chloride 0.9 % bolus 1,000 mL (has no administration in time range)  morphine 4 MG/ML injection 4 mg (has no administration in time range)    ED Course  I have reviewed the triage vital signs and the nursing notes.  Pertinent labs & imaging results that were available during my care  of the patient were reviewed by me and considered in my medical decision making (see chart for details).    MDM Rules/Calculators/A&P                          Alert 40 year old female no acute distress, nontoxic-appearing HEENT to complain of left-sided abdominal pain, right-sided neck pain, and headache.  Patient abdominal pain present x1 week, no change in pain over this time, no alleviating or aggravating factors.  Patient denies any associated nausea, vomiting, diarrhea, constipation, GU complaints.  Lipase, CBC, CMP, urinalysis ordered while patient was in triage.  On physical exam normoactive bowel sounds, abdomen soft, nondistended, tenderness to left upper and left lower quadrant, no rebound tenderness, no guarding, no mass, no pulsatile mass, no CVA tenderness.  Patient reports her right-sided neck pain and headache began 2 days prior.  Patient denies any falls or traumatic injuries.  Patient reports headache was gradual in onset, pain not maximal in onset.  Denies  any neck stiffness, visual disturbance, slurred speech, facial symmetry, focal neurological deficit.  On physical exam CN II through XII intact, patient able to move all limbs equally without difficulty.  Low suspicion for subarachnoid hemorrhage at this time.  We will give patient 1 L fluid bolus and morphine for pain management of abdomen.  Will give patient Reglan for headache.  Urinalysis shows bacteria few, leukocyte negative, nitrite negative.  Squamous epithelial cells 6-10 observed.  Low suspicion for urinary tract infection as patient has no GU complaints, likely bacteria seen is from squamous epithelial contamination. Lipase within normal limits; low suspicion for acute pancreatitis. Pregnancy test negative low suspicion for intrauterine pregnancy or ectopic pregnancy. CBC is unremarkable. CMP shows potassium slightly lower at 3.2.  Due to patient's left-sided abdominal pain concern for possible diverticulitis or ovarian cyst.  Will obtain contrast abdominal and pelvis CT scan. CT abdomen pelvis shows 1) mild colonic wall thickening involving the splenic flexure and proximal descending colon with some associated increased vascularity which could reflect an acute colitis.  Numerous colonic diverticula including within the segment, acute diverticulitis less favored in the absence of a focal culprit diverticulum. 2) mild thickening in rugal fold hyperemia in the stomach is nonspecific possibly related to peristalsis or gastritis. 3) redemonstration of numerous hepatic cyst stable to slightly increased in size from previous without aggressive or worrisome features. 4) intramural fundal fibroid within the uterus.  On serial repeat examination patient abdomen soft, nondistended, improvement in tenderness.  Patient reports resolution improvement of her headache.  Patient is hemodynamically stable.  Will discharge patient and have her follow-up with gastroenterology.  Discussed results, findings,  treatment and follow up. Patient advised of return precautions. Patient verbalized understanding and agreed with plan.    Final Clinical Impression(s) / ED Diagnoses Final diagnoses:  Colitis  Acute nonintractable headache, unspecified headache type    Rx / DC Orders ED Discharge Orders    None       Berneice Heinrich 03/30/21 0211    Maia Plan, MD 03/30/21 Barry Brunner

## 2021-03-29 NOTE — ED Triage Notes (Signed)
Pt reports LLQ pain x 1 week. Endorses nausea. Last BM today was normal per pt reports. Also c/o right side neck pain today. Denies injury

## 2021-03-29 NOTE — ED Triage Notes (Signed)
Emergency Medicine Provider Triage Evaluation Note  Kelsei Defino , a 40 y.o. female  was evaluated in triage.  Pt complains of neck, headaches, left lower quadrant pain.  Patient states these are her separate complaints, abdominal pain is going on for last week, describes as a constant throbbing sensation, not associated with vomiting, diarrhea, constipation or symptoms, states she feels slightly nausea without vomiting.  Has no severe abdominal history.  Right-sided neck pain started 2 days ago, denies recent trauma to the area, denies IV drug use no fever chills associated with it.  Patient is a headache going on with her neck pain no associated change in vision, paresthesias or weakness upper lower extremities..  Review of Systems  Positive: Headaches, neck pain, left lower quadrant abdominal pain Negative: Negative change in vision, paresthesias, weakness, in the upper or lower extremities.  Physical Exam  BP (!) 149/75 (BP Location: Right Arm)   Pulse 71   Temp 99.6 F (37.6 C) (Oral)   Resp 17   Ht 5\' 8"  (1.727 m)   Wt 68 kg   LMP 03/03/2021 (Exact Date)   SpO2 100%   BMI 22.81 kg/m  Gen:   Awake, no distress   HEENT:  Atraumatic  Resp:  Normal effort  Cardiac:  Normal rate  MSK:   Moves extremities without difficulty  Neuro:  Speech clear   Medical Decision Making  Medically screening exam initiated at 6:54 PM.  Appropriate orders placed.  Maritssa Haughton was informed that the remainder of the evaluation will be completed by another provider, this initial triage assessment does not replace that evaluation, and the importance of remaining in the ED until their evaluation is complete.  Clinical Impression  Patient has right-sided neck pain, headaches, left lower quadrant pain lab work imaging is ordered, patient will need further work-up here in the emergency department.   Arminda Resides, PA-C 03/29/21 1856

## 2021-03-29 NOTE — Discharge Instructions (Signed)
You came to the emergency department today to be evaluated for your abdominal pain, headache, and neck pain.  Your physical exam and lab work were reassuring.  The CT scan of your abdomen and pelvis showed that you have colitis, it is inflammation of your colon.  This will improve over time.  I have given information to follow-up with a gastroenterologist.  You schedule an appointment with them.  Please take Ibuprofen (Advil, motrin) and Tylenol (acetaminophen) to relieve your pain.    You may take up to 600 MG (3 pills) of normal strength ibuprofen every 8 hours as needed.   You make take tylenol, up to 1,000 mg (two extra strength pills) every 8 hours as needed.   It is safe to take ibuprofen and tylenol at the same time as they work differently.   Do not take more than 3,000 mg tylenol in a 24 hour period (not more than one dose every 8 hours.  Please check all medication labels as many medications such as pain and cold medications may contain tylenol.  Do not drink alcohol while taking these medications.  Do not take other NSAID'S while taking ibuprofen (such as aleve or naproxen).  Please take ibuprofen with food to decrease stomach upset.  Get help right away if: You have a fever that does not go away with treatment. You develop chills. You have extreme weakness, fainting, or dehydration. You vomit repeatedly. You develop severe pain in your abdomen. You pass bloody or tarry stool. Your headache becomes severe quickly. Your headache gets worse after moderate to intense physical activity. You have repeated vomiting. You have a stiff neck. You have a loss of vision. You have problems with speech. You have pain in the eye or ear. You have muscular weakness or loss of muscle control. You lose your balance or have trouble walking. You feel faint or pass out. You have confusion. You have a seizure.

## 2021-03-31 ENCOUNTER — Ambulatory Visit: Payer: BC Managed Care – PPO | Admitting: Family Medicine

## 2021-03-31 NOTE — Progress Notes (Deleted)
  Sacora Hawbaker - 40 y.o. female MRN [id number]  Date of birth: 1981/06/22  SUBJECTIVE:  Including CC & ROS.  No chief complaint on file.   Clayton Jarmon is a 40 y.o. female that is  ***.  ***   Review of Systems See HPI   HISTORY: Past Medical, Surgical, Social, and Family History Reviewed & Updated per EMR.   Pertinent Historical Findings include:  Past Medical History:  Diagnosis Date  . Herpes genitalia     Past Surgical History:  Procedure Laterality Date  . TUBAL LIGATION      No family history on file.  Social History   Socioeconomic History  . Marital status: Single    Spouse name: Not on file  . Number of children: Not on file  . Years of education: Not on file  . Highest education level: Not on file  Occupational History  . Not on file  Tobacco Use  . Smoking status: Never Smoker  . Smokeless tobacco: Never Used  Vaping Use  . Vaping Use: Never used  Substance and Sexual Activity  . Alcohol use: Yes  . Drug use: No  . Sexual activity: Not on file  Other Topics Concern  . Not on file  Social History Narrative  . Not on file   Social Determinants of Health   Financial Resource Strain: Not on file  Food Insecurity: Not on file  Transportation Needs: Not on file  Physical Activity: Not on file  Stress: Not on file  Social Connections: Not on file  Intimate Partner Violence: Not on file     PHYSICAL EXAM:  VS: LMP 03/03/2021 (Exact Date) Comment: Neg U-preg Physical Exam Gen: NAD, alert, cooperative with exam, well-appearing MSK:  ***      ASSESSMENT & PLAN:   No problem-specific Assessment & Plan notes found for this encounter.

## 2021-04-02 ENCOUNTER — Ambulatory Visit: Payer: BC Managed Care – PPO | Admitting: Family Medicine

## 2021-04-02 NOTE — Progress Notes (Deleted)
  Ashlee Booth - 39 y.o. female MRN [id number]  Date of birth: 01/26/1981  SUBJECTIVE:  Including CC & ROS.  No chief complaint on file.   Ashlee Booth is a 39 y.o. female that is  ***.  ***   Review of Systems See HPI   HISTORY: Past Medical, Surgical, Social, and Family History Reviewed & Updated per EMR.   Pertinent Historical Findings include:  Past Medical History:  Diagnosis Date  . Herpes genitalia     Past Surgical History:  Procedure Laterality Date  . TUBAL LIGATION      No family history on file.  Social History   Socioeconomic History  . Marital status: Single    Spouse name: Not on file  . Number of children: Not on file  . Years of education: Not on file  . Highest education level: Not on file  Occupational History  . Not on file  Tobacco Use  . Smoking status: Never Smoker  . Smokeless tobacco: Never Used  Vaping Use  . Vaping Use: Never used  Substance and Sexual Activity  . Alcohol use: Yes  . Drug use: No  . Sexual activity: Not on file  Other Topics Concern  . Not on file  Social History Narrative  . Not on file   Social Determinants of Health   Financial Resource Strain: Not on file  Food Insecurity: Not on file  Transportation Needs: Not on file  Physical Activity: Not on file  Stress: Not on file  Social Connections: Not on file  Intimate Partner Violence: Not on file     PHYSICAL EXAM:  VS: LMP 03/03/2021 (Exact Date) Comment: Neg U-preg Physical Exam Gen: NAD, alert, cooperative with exam, well-appearing MSK:  ***      ASSESSMENT & PLAN:   No problem-specific Assessment & Plan notes found for this encounter.     

## 2021-04-08 ENCOUNTER — Encounter (HOSPITAL_BASED_OUTPATIENT_CLINIC_OR_DEPARTMENT_OTHER): Payer: Self-pay

## 2021-04-08 ENCOUNTER — Other Ambulatory Visit: Payer: Self-pay

## 2021-04-08 ENCOUNTER — Emergency Department (HOSPITAL_BASED_OUTPATIENT_CLINIC_OR_DEPARTMENT_OTHER)
Admission: EM | Admit: 2021-04-08 | Discharge: 2021-04-08 | Disposition: A | Discharge status: Home / Self Care | Payer: BC Managed Care – PPO | Attending: Emergency Medicine | Admitting: Emergency Medicine

## 2021-04-08 DIAGNOSIS — H9313 Tinnitus, bilateral: Secondary | ICD-10-CM | POA: Insufficient documentation

## 2021-04-08 DIAGNOSIS — R531 Weakness: Secondary | ICD-10-CM | POA: Diagnosis not present

## 2021-04-08 DIAGNOSIS — R109 Unspecified abdominal pain: Secondary | ICD-10-CM | POA: Insufficient documentation

## 2021-04-08 DIAGNOSIS — R519 Headache, unspecified: Secondary | ICD-10-CM | POA: Insufficient documentation

## 2021-04-08 DIAGNOSIS — R5383 Other fatigue: Secondary | ICD-10-CM | POA: Diagnosis not present

## 2021-04-08 LAB — BASIC METABOLIC PANEL
Anion gap: 8 (ref 5–15)
BUN: 11 mg/dL (ref 6–20)
CO2: 25 mmol/L (ref 22–32)
Calcium: 8.8 mg/dL — ABNORMAL LOW (ref 8.9–10.3)
Chloride: 104 mmol/L (ref 98–111)
Creatinine, Ser: 0.84 mg/dL (ref 0.44–1.00)
GFR, Estimated: >60 mL/min (ref >60)
Glucose, Bld: 102 mg/dL — ABNORMAL HIGH (ref 70–99)
Potassium: 3.7 mmol/L (ref 3.5–5.1)
Sodium: 137 mmol/L (ref 135–145)

## 2021-04-08 LAB — CBC WITH DIFFERENTIAL/PLATELET
Abs Immature Granulocytes: 0.01 10*3/uL (ref 0.00–0.07)
Basophils Absolute: 0 10*3/uL (ref 0.0–0.1)
Basophils Relative: 1 %
Eosinophils Absolute: 0.2 10*3/uL (ref 0.0–0.5)
Eosinophils Relative: 5 %
HCT: 42.4 % (ref 36.0–46.0)
Hemoglobin: 14.3 g/dL (ref 12.0–15.0)
Immature Granulocytes: 0 %
Lymphocytes Relative: 35 %
Lymphs Abs: 1.3 10*3/uL (ref 0.7–4.0)
MCH: 30.6 pg (ref 26.0–34.0)
MCHC: 33.7 g/dL (ref 30.0–36.0)
MCV: 90.6 fL (ref 80.0–100.0)
Monocytes Absolute: 0.4 10*3/uL (ref 0.1–1.0)
Monocytes Relative: 12 %
Neutro Abs: 1.7 10*3/uL (ref 1.7–7.7)
Neutrophils Relative %: 47 %
Platelets: 246 10*3/uL (ref 150–400)
RBC: 4.68 MIL/uL (ref 3.87–5.11)
RDW: 12.1 % (ref 11.5–15.5)
WBC: 3.7 10*3/uL — ABNORMAL LOW (ref 4.0–10.5)
nRBC: 0 % (ref 0.0–0.2)

## 2021-04-08 MED ORDER — KETOROLAC TROMETHAMINE 15 MG/ML IJ SOLN
15.0000 mg | Freq: Once | INTRAMUSCULAR | Status: AC
  Administered 2021-04-08: 15 mg via INTRAVENOUS
  Filled 2021-04-08: qty 1

## 2021-04-08 MED ORDER — METOCLOPRAMIDE HCL 5 MG/ML IJ SOLN
10.0000 mg | Freq: Once | INTRAMUSCULAR | Status: AC
  Administered 2021-04-08: 10 mg via INTRAVENOUS
  Filled 2021-04-08: qty 2

## 2021-04-08 MED ORDER — DIPHENHYDRAMINE HCL 50 MG/ML IJ SOLN
25.0000 mg | Freq: Once | INTRAMUSCULAR | Status: AC
  Administered 2021-04-08: 25 mg via INTRAVENOUS
  Filled 2021-04-08: qty 1

## 2021-04-08 MED ORDER — SODIUM CHLORIDE 0.9 % IV BOLUS
1000.0000 mL | Freq: Once | INTRAVENOUS | Status: AC
  Administered 2021-04-08: 1000 mL via INTRAVENOUS

## 2021-04-08 NOTE — ED Provider Notes (Addendum)
[facility] EMERGENCY DEPARTMENT Provider Note   CSN: [id number] Arrival date & time: 04/08/21  4174     History Chief Complaint  Patient presents with  . Generalized Body Aches    Ashlee Booth is a 40 y.o. female.  HPI 40 year old female presents with over a month and a half of not feeling well.  Chiefly she notes headache that is frontal and occipital, generalized weakness and fatigue and tinnitus.  All of these have been for about a month and a half.  The headache comes and goes, not necessarily every day but frequently throughout the week.  It is sharp and currently an 8 out of 10.  No vomiting or diarrhea.  Has some abdominal pain recently that has not quite resolved and was diagnosed with colitis.  She also feels generally weak and has been and this morning was so weak she had a hard time getting out of bed.  No fevers during this time.  No current chest pain or shortness of breath.  The tinnitus seems to be right-sided but might be bilateral.  No numbness or focal weakness.   Past Medical History:  Diagnosis Date  . Herpes genitalia     Patient Active Problem List   Diagnosis Date Noted  . Referred otalgia, right 03/12/2021  . 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 TUBAL LIGATION       OB History   No obstetric history on file.     History reviewed. No pertinent family history.  Social History   Tobacco Use  . Smoking status: Never Smoker  . Smokeless tobacco: Never Used  Vaping Use  . Vaping Use: Never used  Substance Use Topics  . Alcohol use: Yes  . Drug use: No    Home Medications Prior to Admission medications   Medication Sig Start Date End Date Taking? Authorizing Provider  famotidine (PEPCID) 20 MG tablet Take 20 mg by mouth 2 (two) times daily.   Yes [provider]  ibuprofen (ADVIL) 400 MG tablet Take 1 tablet (400 mg total) by mouth every 6 (six) hours as  needed. 03/03/21  Yes Palumbo, April, MD  busPIRone (BUSPAR) 10 MG tablet Take by mouth. 03/09/21   [provider]  predniSONE (STERAPRED UNI-PAK 21 TAB) 10 MG (21) TBPK tablet Take 6 tabs for 2 days, then 5 for 2 days, then 4 for 2 days, then 3 for 2 days, 2 for 2 days, then 1 for 2 days 03/12/21   Jacalyn Lefevre, MD    Allergies    Patient has no known allergies.  Review of Systems   Review of Systems  Constitutional: Positive for fatigue. Negative for fever.  HENT: Positive for tinnitus.   Respiratory: Negative for shortness of breath.   Cardiovascular: Negative for chest pain.  Gastrointestinal: Positive for abdominal pain. Negative for diarrhea and vomiting.  Musculoskeletal: Negative for neck stiffness.  Neurological: Positive for weakness and headaches. Negative for numbness.  All other systems reviewed and are negative.   Physical Exam Updated Vital Signs BP 96/66   Pulse 68   Temp 98.2 F (36.8 C) (Oral)   Resp 16   Ht 5\' 8"  (1.727 m)   Wt 68 kg   SpO2 100%   BMI 22.81 kg/m   Physical Exam Vitals and nursing note reviewed.  Constitutional:      General: She is not in acute distress.    Appearance: She is well-developed.  She is not ill-appearing or diaphoretic.  HENT:     Head: Normocephalic and atraumatic.     Right Ear: Tympanic membrane, ear canal and external ear normal.     Left Ear: Tympanic membrane, ear canal and external ear normal.     Nose: Nose normal.  Eyes:     General:        Right eye: No discharge.        Left eye: No discharge.     Extraocular Movements: Extraocular movements intact.     Pupils: Pupils are equal, round, and reactive to light.  Cardiovascular:     Rate and Rhythm: Normal rate and regular rhythm.     Heart sounds: Normal heart sounds.  Pulmonary:     Effort: Pulmonary effort is normal.     Breath sounds: Normal breath sounds.  Abdominal:     Palpations: Abdomen is soft.     Tenderness: There is no abdominal  tenderness.  Musculoskeletal:     Cervical back: Normal range of motion and neck supple. No rigidity.  Skin:    General: Skin is warm and dry.  Neurological:     Mental Status: She is alert.     Comments: CN 3-12 grossly intact. 5/5 strength in all 4 extremities. Grossly normal sensation. Normal finger to nose.   Psychiatric:        Mood and Affect: Mood is not anxious.     ED Results / Procedures / Treatments   Labs (all labs ordered are listed, but only abnormal results are displayed) Labs Reviewed  BASIC METABOLIC PANEL - Abnormal; Notable for the following components:      Result Value   Glucose, Bld 102 (*)    Calcium 8.8 (*)    All other components within normal limits  CBC WITH DIFFERENTIAL/PLATELET - Abnormal; Notable for the following components:   WBC 3.7 (*)    All other components within normal limits  PREGNANCY, URINE    EKG EKG Interpretation  Date/Time:  Wednesday April 08 2021 08:14:48 EDT Ventricular Rate:  62 PR Interval:  131 QRS Duration: 86 QT Interval:  405 QTC Calculation: 412 R Axis:   65 Text Interpretation: Sinus rhythm no acute ST/T changes Confirmed by Pricilla Loveless [id number]) on 04/08/2021 8:25:34 AM   Radiology No results found.  Procedures Procedures   Medications Ordered in ED Medications  sodium chloride 0.9 % bolus 1,000 mL (1,000 mLs Intravenous New Bag/Given 04/08/21 0823)  ketorolac (TORADOL) 15 MG/ML injection 15 mg (15 mg Intravenous Given 04/08/21 0823)  metoCLOPramide (REGLAN) injection 10 mg (10 mg Intravenous Given 04/08/21 0824)  diphenhydrAMINE (BENADRYL) injection 25 mg (25 mg Intravenous Given 04/08/21 3244)    ED Course  I have reviewed the triage vital signs and the nursing notes.  Pertinent labs & imaging results that were available during my care of the patient were reviewed by me and considered in my medical decision making (see chart for details).    MDM Rules/Calculators/A&P                           Patient presents with symptoms for over a month.  Head CT from last month was reviewed and unremarkable.  I do not think further CNS imaging is needed.  I have very low suspicion for infection.  Overall there is no focal neurodeficits.  Unclear why she has had multiple symptoms for this long but at this point she appears stable  for discharge home.  She is feeling better after fluids and headache medicine.  She declines pregnancy testing. Will discharge home to follow-up with PCP. Final Clinical Impression(s) / ED Diagnoses Final diagnoses:  Frontal headache    Rx / DC Orders ED Discharge Orders    None       Pricilla Loveless, MD 04/08/21 2111    Pricilla Loveless, MD 04/08/21 [phone]

## 2021-04-08 NOTE — Discharge Instructions (Signed)
If you develop continued, recurrent, or worsening headache, fever, neck stiffness, vomiting, blurry or double vision, weakness or numbness in your arms or legs, trouble speaking, or any other new/concerning symptoms then return to the ER for evaluation.  

## 2021-04-08 NOTE — ED Triage Notes (Signed)
Generalized body aches/weakness with HA and bilateral ear pain x 1.5 months.  Pt states she has been back and forth to her PCP and the ED with similar c/o.  No OTC medications PTA

## 2021-04-13 ENCOUNTER — Other Ambulatory Visit: Payer: Self-pay | Admitting: Urgent Care

## 2021-04-13 DIAGNOSIS — R519 Headache, unspecified: Secondary | ICD-10-CM

## 2021-04-24 ENCOUNTER — Ambulatory Visit
Admission: RE | Admit: 2021-04-24 | Discharge: 2021-04-24 | Disposition: A | Discharge status: Home / Self Care | Payer: BC Managed Care – PPO | Source: Ambulatory Visit | Attending: Urgent Care | Admitting: Urgent Care

## 2021-04-24 DIAGNOSIS — R519 Headache, unspecified: Secondary | ICD-10-CM

## 2021-06-08 ENCOUNTER — Ambulatory Visit: Payer: BC Managed Care – PPO | Admitting: Neurology

## 2021-06-08 ENCOUNTER — Encounter: Payer: Self-pay | Admitting: Neurology

## 2021-06-08 DIAGNOSIS — Z029 Encounter for administrative examinations, unspecified: Secondary | ICD-10-CM

## 2021-07-31 IMAGING — MR MR HEAD W/O CM
11 series · 48 of 48 positions shown · non-contrast
Comparison: Head CT 03/03/2021.

CLINICAL DATA: Intractable headache, unspecified chronicity
pattern, unspecified headache type. Additional history provided by
scanning technologist: Patient reports frontal headaches for 3
months.

EXAM:
MRI HEAD WITHOUT CONTRAST
TECHNIQUE: Multiplanar, multiecho pulse sequences of the brain and surrounding
structures were obtained without intravenous contrast.

[Series 5: T1 · sagittal · 4.0mm · 0.75mm/px · 1 of 31 slices shown (1 of 2)]
[im 1/31]
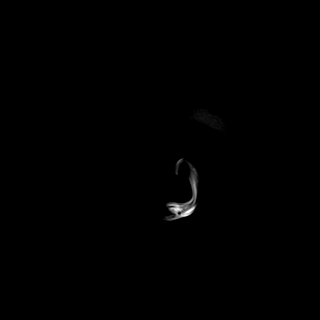

[Series 6: DWI · axial · 3.0mm · 0.94mm/px · z∈[-39,+136]mm · 11 of 196 slices shown (1 of 3)]
[im 1/196]
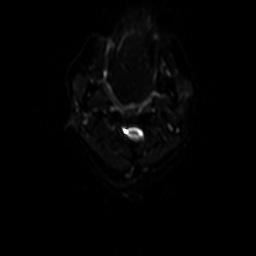
[im 20/196]
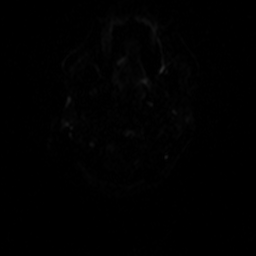
[im 40/196]
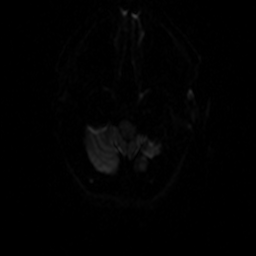
[im 59/196]
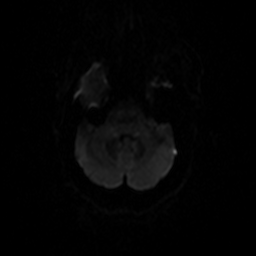
[im 79/196]
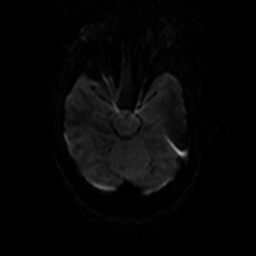
[im 98/196]
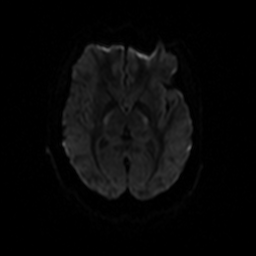
[im 118/196]
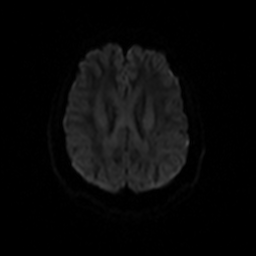
[im 137/196]
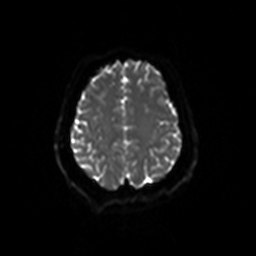
[im 157/196]
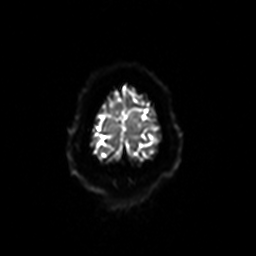
[im 176/196]
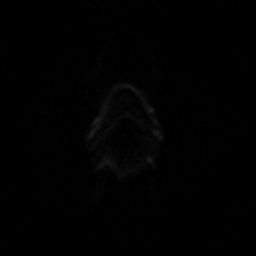
[im 196/196]
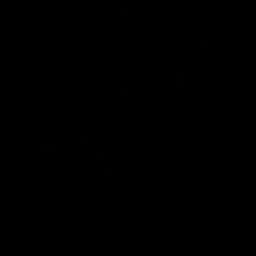

[Series 7: ax dwi_tracew · axial · 3.0mm · 0.94mm/px · z∈[-39,+136]mm · 6 of 99 slices shown]
[im 1/99]
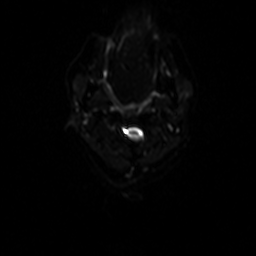
[im 20/99]
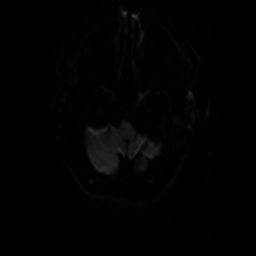
[im 40/99]
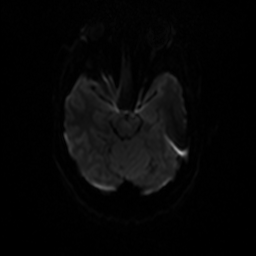
[im 59/99]
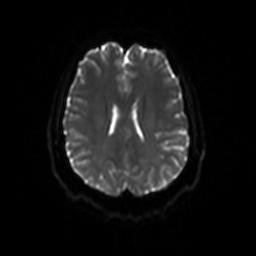
[im 79/99]
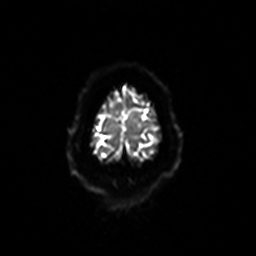
[im 99/99]
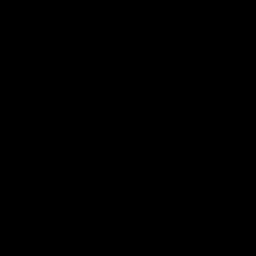

[Series 8: ax dwi_adc · axial · 3.0mm · 0.94mm/px · z∈[-39,+121]mm · 3 of 46 slices shown]
[im 1/46]
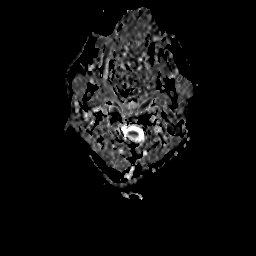
[im 23/46]
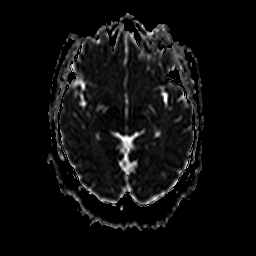
[im 46/46]
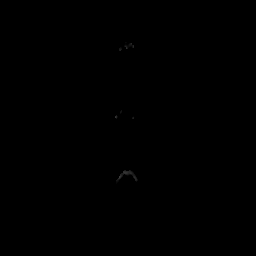

[Series 9: DWI · coronal · 5.0mm · 1.50mm/px · 4 of 78 slices shown (2 of 3)]
[im 1/78]
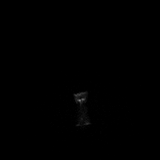
[im 26/78]
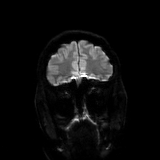
[im 52/78]
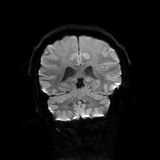
[im 78/78]
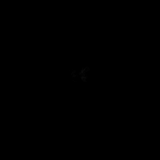

[Series 10: DWI · coronal · 5.0mm · 1.50mm/px · 2 of 38 slices shown (3 of 3)]
[im 1/38]
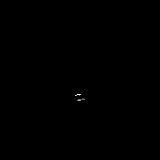
[im 38/38]
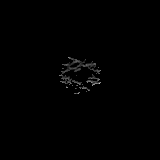

[Series 11: T2 · axial · 4.0mm · 0.38mm/px · z∈[-36,+122]mm · 2 of 32 slices shown (1 of 2)]
[im 1/32]
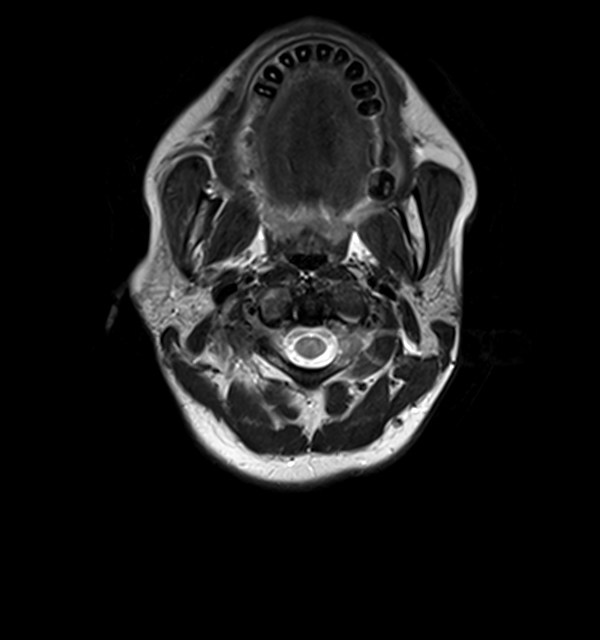
[im 32/32]
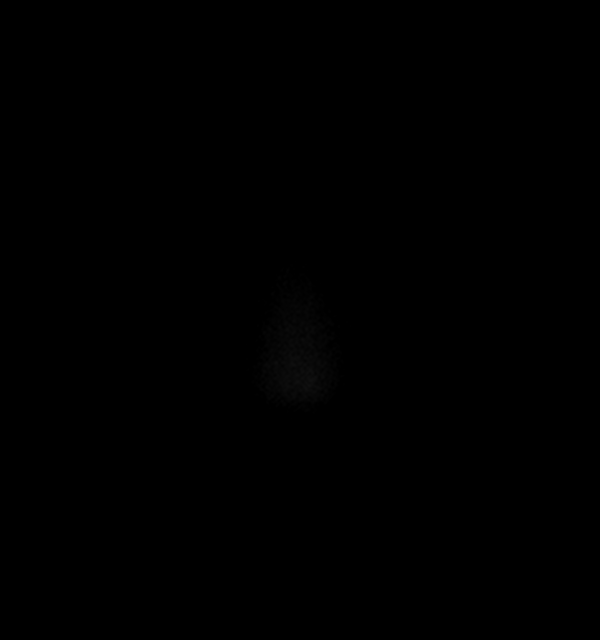

[Series 12: FLAIR · axial · 3.0mm · 0.75mm/px · z∈[-37,+123]mm · 2 of 28 slices shown]
[im 1/28]
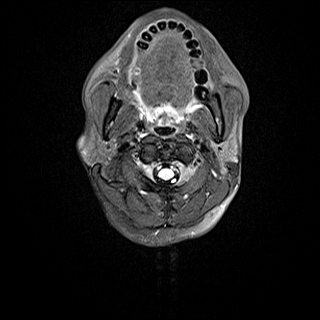
[im 28/28]
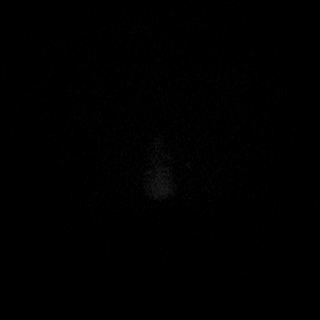

[Series 13: swi_images · axial · 1.5mm · 0.94mm/px · z∈[-27,+113]mm · 5 of 96 slices shown]
[im 1/96]
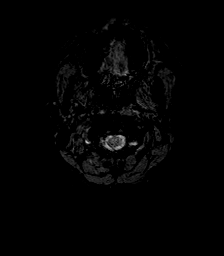
[im 24/96]
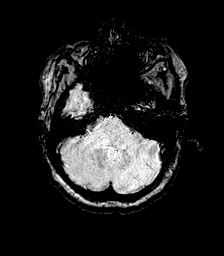
[im 48/96]
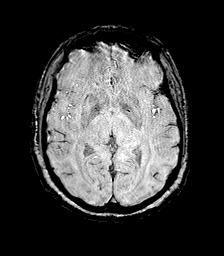
[im 72/96]
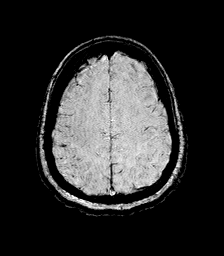
[im 96/96]
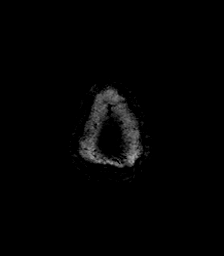

[Series 15: T1 · axial · 1.0mm · 0.94mm/px · z∈[-49,+118]mm · 10 of 170 slices shown (2 of 2)]
[im 1/170]
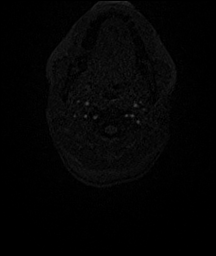
[im 19/170]
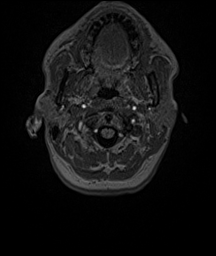
[im 38/170]
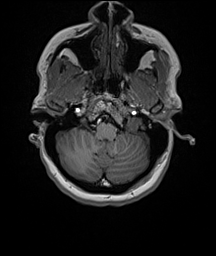
[im 57/170]
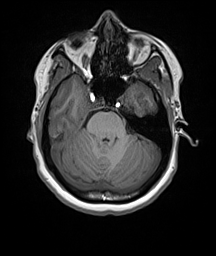
[im 76/170]
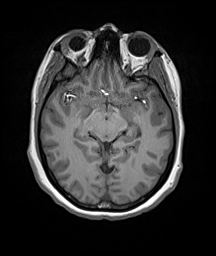
[im 94/170]
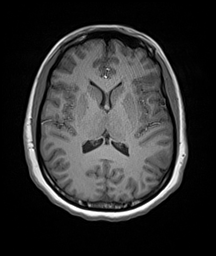
[im 113/170]
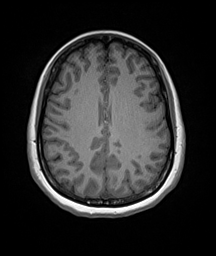
[im 132/170]
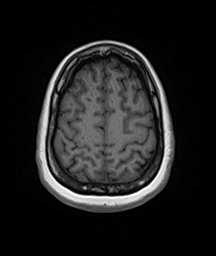
[im 151/170]
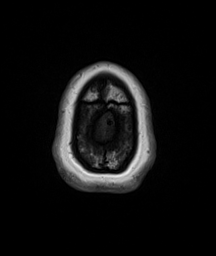
[im 170/170]
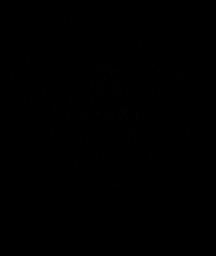

[Series 16: T2 · coronal · 4.5mm · 0.38mm/px · 2 of 37 slices shown (2 of 2)]
[im 1/37]
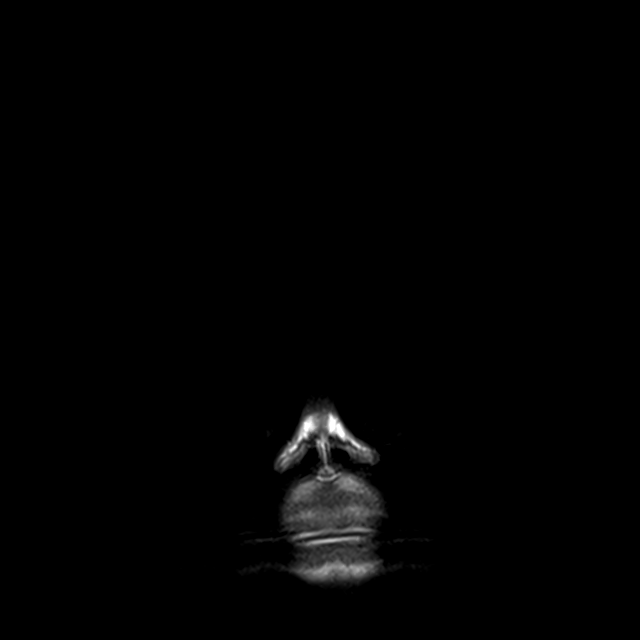
[im 37/37]
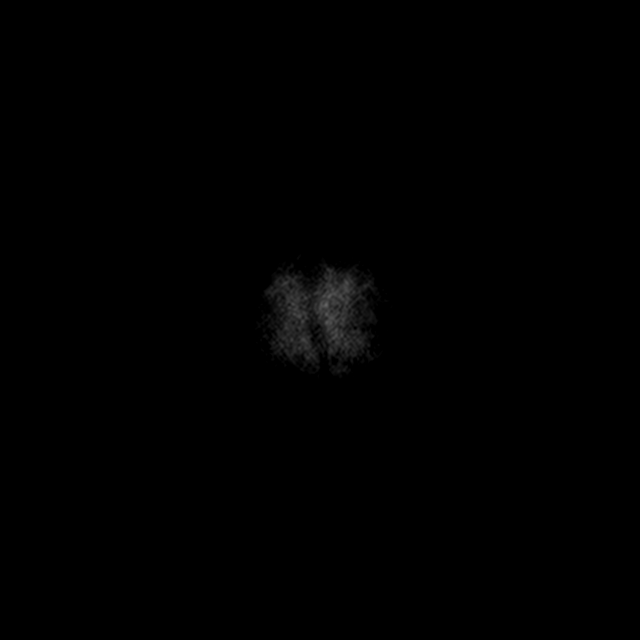

[48 of 48 positions shown; findings below may reference images not displayed]

FINDINGS: Brain:

Cerebral volume is normal.

No cortical encephalomalacia.

No significant white matter disease.

There is no acute infarct.

No evidence of intracranial mass.

No chronic intracranial blood products.

No extra-axial fluid collection.

No midline shift.

Vascular: Expected proximal arterial flow voids.

Skull and upper cervical spine: No focal marrow lesion.

Sinuses/Orbits: Visualized orbits show no acute finding. Trace right
frontal sinus mucosal thickening. Tiny mucous retention cysts within
the maxillary sinuses bilaterally.
IMPRESSION: Unremarkable non-contrast MRI appearance of the brain. No evidence
of acute intracranial abnormality.

Minimal paranasal sinus disease, as described.

## 2022-03-31 ENCOUNTER — Ambulatory Visit (INDEPENDENT_AMBULATORY_CARE_PROVIDER_SITE_OTHER): Payer: BC Managed Care – PPO

## 2022-03-31 ENCOUNTER — Ambulatory Visit (INDEPENDENT_AMBULATORY_CARE_PROVIDER_SITE_OTHER): Payer: BC Managed Care – PPO | Admitting: Family

## 2022-03-31 ENCOUNTER — Encounter: Payer: Self-pay | Admitting: Family

## 2022-03-31 DIAGNOSIS — M5416 Radiculopathy, lumbar region: Secondary | ICD-10-CM | POA: Diagnosis not present

## 2022-03-31 DIAGNOSIS — G8929 Other chronic pain: Secondary | ICD-10-CM

## 2022-03-31 DIAGNOSIS — M25562 Pain in left knee: Secondary | ICD-10-CM

## 2022-03-31 MED ORDER — PREDNISONE 50 MG PO TABS: ORAL_TABLET | ORAL | 0 refills | Status: DC

## 2022-03-31 NOTE — Progress Notes (Signed)
? ?Office Visit Note ?  ?Patient: Ashlee Booth           ?Date of Birth: 05/30/1981           ?MRN: [id number] ?Visit Date: 03/31/2022 ?             ?Requested by: Vonna Kotyk, NP ?[phone] ?[address] ?[place],  [place] [address] ?PCP: Vonna Kotyk, NP ? ?No chief complaint on file. ? ? ? ? ?HPI: ?The patient is a 41 year old woman who presents today complaining of bilateral calf pain.  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  She has been doing this for the type of work for the last 2 years has not had any recent increase in the amount of time she has been on her feet.  No swelling or redness in the calves. ? ?She does go on to say that she is having some left lateral thigh pain the left calf is more painful than her right.  She is having pain that radiates from her hip and buttock down the lateral aspect of her leg this is associated with some heaviness. ? ?No red flag symptoms. ? ?Assessment & Plan: ?Visit Diagnoses: No diagnosis found. ? ?Plan: Concern for a left-sided lumbar radiculopathy.  We will trial her on a course of prednisone ? ?Follow-Up Instructions: No follow-ups on file.  ? ?Back Exam  ? ?Tenderness  ?The patient is experiencing no tenderness.  ? ?Range of Motion  ?The patient has normal back ROM. ? ?Muscle Strength  ?The patient has normal back strength. ? ?Tests  ?Straight leg raise left: positive ? ?Other  ?Gait: normal  ? ? ? ? ?Patient is alert, oriented, no adenopathy, well-dressed, normal affect, normal respiratory effort. ?Calf nontender no erythema or warmth no point tenderness ? ?Imaging: ?No results found. ?No images are attached to the encounter. ? ?Labs: ?Lab Results  ?Component Value Date  ? REPTSTATUS 03/21/2019 FINAL 03/20/2019  ? CULT  03/20/2019  ?  NO GROWTH ?Performed at [facility], [address]  [place], [place] [address] ?  ? ? ? ?Lab Results  ?Component Value Date  ? ALBUMIN 4.3 03/29/2021  ? ALBUMIN 4.2 03/12/2021  ? ALBUMIN 4.4 03/20/2019  ? ? ?No results found for: MG ?No results found for: VD25OH ? ?No results found for: PREALBUMIN ? ?  Latest Ref Rng & Units 04/08/2021  ?  8:18 AM 03/29/2021  ?  8:40 PM 03/12/2021  ?  1:02 PM  ?CBC EXTENDED  ?WBC 4.0 - 10.5 K/uL 3.7   7.1   4.7    ?RBC 3.87 - 5.11 MIL/uL 4.68   5.05   4.87    ?Hemoglobin 12.0 - 15.0 g/dL 35.4   65.6   81.2    ?HCT 36.0 - 46.0 % 42.4   45.4   43.5    ?Platelets 150 - 400 K/uL 246   251   230    ?NEUT# 1.7 - 7.7 K/uL 1.7   3.6     ?Lymph# 0.7 - 4.0 K/uL 1.3   2.6     ? ? ? ?There is no height or weight on file to calculate BMI. ? ?Orders:  ?No orders of the defined types were placed in this encounter. ? ?No orders of the defined types were placed  in this encounter. ? ? ? Procedures: ?No procedures performed ? ?Clinical Data: ?No additional findings. ? ?ROS: ? ?All other systems negative, except as noted in the HPI. ?Review of Systems ? ?Objective: ?Vital Signs: There were no vitals taken for this visit. ? ?Specialty Comments:  ?No specialty comments available. ? ?PMFS History: ?Patient Active Problem List  ? Diagnosis Date Noted  ? Referred otalgia, right 03/12/2021  ? 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 ?Past Medical History:  ?Diagnosis Date  ? Herpes genitalia   ?  ?History reviewed. No pertinent family history.  ?Past Surgical History:  ?Procedure Laterality Date  ? TUBAL LIGATION    ? ?Social History  ? ?Occupational History  ? Not on file  ?Tobacco Use  ? Smoking status: Never  ? Smokeless tobacco: Never  ?Vaping Use  ? Vaping Use: Never used  ?Substance and Sexual Activity  ? Alcohol use: Yes  ? Drug use: No  ? Sexual activity: Not on file  ? ? ? ? ? ?

## 2022-04-16 ENCOUNTER — Ambulatory Visit (INDEPENDENT_AMBULATORY_CARE_PROVIDER_SITE_OTHER): Payer: BC Managed Care – PPO | Admitting: Family

## 2022-04-16 DIAGNOSIS — M5416 Radiculopathy, lumbar region: Secondary | ICD-10-CM

## 2022-04-16 MED ORDER — PREDNISONE 10 MG PO TABS: 10.0000 mg | ORAL_TABLET | Freq: Every day | ORAL | 0 refills | Status: DC

## 2022-04-16 NOTE — Progress Notes (Signed)
? ?Office Visit Note ?  ?Patient: Ashlee Booth           ?Date of Birth: 1981/05/03           ?MRN: [id number] ?Visit Date: 04/16/2022 ?             ?Requested by: Vonna Kotyk, NP ?[phone] ?[address] ?[place],  [place] [address] ?PCP: Vonna Kotyk, NP ? ?Chief Complaint  ?Patient presents with  ? Lower Back - Pain, Follow-up  ? ? ? ? ? ?HPI: ?The patient is a 41 year old woman who presents today in follow-up for lumbar radiculopathy this is bilateral left much worse than right ? ? 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  She has been doing this for the type of work for the last 2 years has not had any recent increase in the amount of time she has been on her feet.  No swelling or redness in the calves. ? ?She does go on to say that she is having some left lateral thigh pain the left calf is more painful than her right.  She is having pain that radiates from her hip and buttock down the lateral aspect of her leg this is associated with some heaviness. ? ?No red flag symptoms. ? ?About 2 weeks ago he was given a course of prednisone and this did ease her symptoms while she was taking it unfortunately as soon as she stopped taking the prednisone her symptoms are turned ? ?Assessment & Plan: ?Visit Diagnoses: No diagnosis found. ? ?Plan: Left-sided lumbar radiculopathy.  She would like to proceed with epidural steroid injection ? ?Follow-Up Instructions: No follow-ups on file.  ? ?Back Exam  ? ?Tenderness  ?The patient is experiencing no tenderness.  ? ?Range of Motion  ?The patient has normal back ROM. ? ?Muscle Strength  ?The patient has normal back strength. ? ?Tests  ?Straight leg raise left: positive ? ?Other  ?Gait: normal  ? ? ? ? ?Patient is alert, oriented, no adenopathy, well-dressed, normal affect, normal respiratory effort. ?Calf nontender no  erythema or warmth no point tenderness ? ?Imaging: ?No results found. ?No images are attached to the encounter. ? ?Labs: ?Lab Results  ?Component Value Date  ? REPTSTATUS 03/21/2019 FINAL 03/20/2019  ? CULT  03/20/2019  ?  NO GROWTH ?Performed at [facility], [address], [place], [place] [address] ?  ? ? ? ?Lab Results  ?Component Value Date  ? ALBUMIN 4.3 03/29/2021  ? ALBUMIN 4.2 03/12/2021  ? ALBUMIN 4.4 03/20/2019  ? ? ?No results found for: MG ?No results found for: VD25OH ? ?No results found for: PREALBUMIN ? ?  Latest Ref Rng & Units 04/08/2021  ?  8:18 AM 03/29/2021  ?  8:40 PM 03/12/2021  ?  1:02 PM  ?CBC EXTENDED  ?WBC 4.0 - 10.5 K/uL 3.7   7.1   4.7    ?RBC 3.87 - 5.11 MIL/uL 4.68   5.05   4.87    ?Hemoglobin 12.0 - 15.0 g/dL 36.1   44.3   15.4    ?HCT 36.0 - 46.0 % 42.4   45.4   43.5    ?Platelets 150 - 400 K/uL 246   251   230    ?NEUT# 1.7 - 7.7 K/uL 1.7   3.6     ?  Lymph# 0.7 - 4.0 K/uL 1.3   2.6     ? ? ? ?There is no height or weight on file to calculate BMI. ? ?Orders:  ?No orders of the defined types were placed in this encounter. ? ?No orders of the defined types were placed in this encounter. ? ? ? Procedures: ?No procedures performed ? ?Clinical Data: ?No additional findings. ? ?ROS: ? ?All other systems negative, except as noted in the HPI. ?Review of Systems ? ?Objective: ?Vital Signs: There were no vitals taken for this visit. ? ?Specialty Comments:  ?No specialty comments available. ? ?PMFS History: ?Patient Active Problem List  ? Diagnosis Date Noted  ? Referred otalgia, right 03/12/2021  ? 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 ?Past Medical History:  ?Diagnosis Date  ? Herpes genitalia   ?  ?No family history on file.  ?Past Surgical History:  ?Procedure Laterality Date  ? TUBAL LIGATION    ? ?Social History  ? ?Occupational History  ? Not on file  ?Tobacco Use  ? Smoking status: Never  ? Smokeless tobacco: Never  ?Vaping Use  ? Vaping Use: Never used  ?Substance and  Sexual Activity  ? Alcohol use: Yes  ? Drug use: No  ? Sexual activity: Not on file  ? ? ? ? ? ?

## 2022-05-19 ENCOUNTER — Telehealth: Payer: Self-pay | Admitting: Family

## 2022-05-19 NOTE — Telephone Encounter (Signed)
Referral made on 04/16/22 to see Dr. Alvester Morin, our office made several attempts to contact her about her insurance with no contact. Also, Erin sent in prednisone the day of her appt on 04/16/22.

## 2022-05-19 NOTE — Telephone Encounter (Signed)
Pt called and states she is suppose to be getting a steroid shot in her back. Also she said she was suppose to have prednisone called in but it never was.

## 2022-05-19 NOTE — Telephone Encounter (Signed)
[email or web address]

## 2022-05-25 ENCOUNTER — Other Ambulatory Visit: Payer: Self-pay | Admitting: Family

## 2022-05-25 DIAGNOSIS — M5416 Radiculopathy, lumbar region: Secondary | ICD-10-CM

## 2022-05-25 MED ORDER — PREDNISONE 10 MG PO TABS: 10.0000 mg | ORAL_TABLET | Freq: Every day | ORAL | 0 refills | Status: DC

## 2022-05-25 NOTE — Telephone Encounter (Signed)
SW pt, she is aware. I will put in a new referral for Dr. Alvester Morin. She was informed that she will be contacted again about her insurance information and for her to be on the look out for this call. She stated she never got the prednisone, I told her I would send this in again today to [facility] on [address].

## 2022-05-25 NOTE — Telephone Encounter (Signed)
[address]  Rx and referral sent.

## 2022-06-30 ENCOUNTER — Encounter: Payer: Self-pay | Admitting: Physical Medicine and Rehabilitation

## 2022-06-30 ENCOUNTER — Ambulatory Visit (INDEPENDENT_AMBULATORY_CARE_PROVIDER_SITE_OTHER): Payer: BC Managed Care – PPO | Admitting: Physical Medicine and Rehabilitation

## 2022-06-30 ENCOUNTER — Ambulatory Visit: Payer: Self-pay

## 2022-06-30 DIAGNOSIS — M5416 Radiculopathy, lumbar region: Secondary | ICD-10-CM | POA: Diagnosis not present

## 2022-06-30 MED ORDER — METHYLPREDNISOLONE ACETATE 80 MG/ML IJ SUSP
80.0000 mg | Freq: Once | INTRAMUSCULAR | Status: AC
  Administered 2022-06-30: 80 mg

## 2022-06-30 NOTE — Progress Notes (Signed)
Pt state lower back pain that tarvels down both legs. Pt satte walking, standing and laying down makes the pain worse. Pt state she doesn't take anything for the pain.  Numeric Pain Rating Scale and Functional Assessment Average Pain 7   In the last MONTH (on 0-10 scale) has pain interfered with the following?  1. General activity like being  able to carry out your everyday physical activities such as walking, climbing stairs, carrying groceries, or moving a chair?  Rating(10)   +Driver, -BT, -Dye Allergies.

## 2022-06-30 NOTE — Patient Instructions (Signed)

## 2022-07-05 ENCOUNTER — Ambulatory Visit (INDEPENDENT_AMBULATORY_CARE_PROVIDER_SITE_OTHER): Payer: BC Managed Care – PPO | Admitting: Physician Assistant

## 2022-07-05 ENCOUNTER — Encounter: Payer: Self-pay | Admitting: Physician Assistant

## 2022-07-05 DIAGNOSIS — M5416 Radiculopathy, lumbar region: Secondary | ICD-10-CM | POA: Diagnosis not present

## 2022-07-05 NOTE — Progress Notes (Signed)
Office Visit Note   Patient: Ashlee Booth           Date of Birth: 08-Sep-1981           MRN: [id number] Visit Date: 07/05/2022              Requested by: Vonna Kotyk, NP [address] [place],  [place] [address] PCP: Vonna Kotyk, NP  Chief Complaint  Patient presents with   Lower Back - Follow-up      HPI: Patient is a 41 year old woman who presents today with lower back pain and spasm.  She has been followed by Denny Peon who saw her in May.  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  She presented to [facility] having some left lateral thigh pain more than her right.  Pain radiating from her hip and buttock down the lateral aspect of her leg associated with some heaviness.  She did do a steroid taper prior to her visit gave some relief but not much.  She was referred to Dr. Alvester Morin who last week did a interlaminal L5-S1 injection.  She presents today saying she got no relief from that and feels like some of the pain is radiating proximally.  Denies any loss of bowel or bladder control or any weakness.  She has had no fever chills or redness about the back with the injection site was   Assessment & Plan: Visit Diagnoses:  1. Radiculopathy, lumbar region     Plan: Patient's had this for now over 6 weeks she failed a prednisone taper as well as a intralaminar injection.  She has not tried physical therapy I think that is important as well as an MRI to further define this.  I spoke with Ellin Goodie who said once this is completed she could follow-up with them  Follow-Up Instructions: After MRI  Ortho Exam  Patient is alert, oriented, no adenopathy, well-dressed, normal affect, normal respiratory effort. Examination she has no paresthesias currently but does have a history of it.  Her strength is 5 out of 5 in with dorsiflexion plantarflexion of her ankles extension flexion of her legs and hips.  She has a  mildly net positive straight leg raise just reproduces some pulling in her back on the left and right.  Imaging: No results found. No images are attached to the encounter.  Labs: Lab Results  Component Value Date   REPTSTATUS 03/21/2019 FINAL 03/20/2019   CULT  03/20/2019    NO GROWTH Performed at [facility], [address] [place], [place] [address]      Lab Results  Component Value Date   ALBUMIN 4.3 03/29/2021   ALBUMIN 4.2 03/12/2021   ALBUMIN 4.4 03/20/2019    No results found for: "MG" No results found for: "VD25OH"  No results found for: "PREALBUMIN"    Latest Ref Rng & Units 04/08/2021    8:18 AM 03/29/2021    8:40 PM 03/12/2021    1:02 PM  CBC EXTENDED  WBC 4.0 - 10.5 K/uL 3.7  7.1  4.7   RBC 3.87 - 5.11 MIL/uL 4.68  5.05  4.87   Hemoglobin 12.0 - 15.0 g/dL 01.7  51.0  25.8   HCT 36.0 - 46.0 % 42.4  45.4  43.5   Platelets 150 - 400 K/uL 246  251  230   NEUT# 1.7 - 7.7 K/uL 1.7  3.6  Lymph# 0.7 - 4.0 K/uL 1.3  2.6       There is no height or weight on file to calculate BMI.  Orders:  Orders Placed This Encounter  Procedures   MR Lumbar Spine w/o contrast   Ambulatory referral to Physical Therapy   No orders of the defined types were placed in this encounter.    Procedures: No procedures performed  Clinical Data: No additional findings.  ROS:  All other systems negative, except as noted in the HPI. Review of Systems  Objective: Vital Signs: There were no vitals taken for this visit.  Specialty Comments:  No specialty comments available.  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 No pertinent family history.  Past Surgical History:  Procedure Laterality Date   TUBAL LIGATION     Social History   Occupational History   Not on file   Tobacco Use   Smoking status: Never   Smokeless tobacco: Never  Vaping Use   Vaping Use: Never used  Substance and Sexual Activity   Alcohol use: Yes   Drug use: No   Sexual activity: Not on file

## 2022-07-12 NOTE — Progress Notes (Signed)
Ashlee Booth - 41 y.o. female MRN [id number]  Date of birth: 1981-05-09  Office Visit Note: Visit Date: 06/30/2022 PCP: Ashlee Kotyk, NP Referred by: Ashlee Kotyk, NP  Subjective: Chief Complaint  Patient presents with   Lower Back - Pain   Right Leg - Pain   Left Leg - Pain   HPI:  Ashlee Booth is a 41 y.o. female who comes in today at the request of Ashlee Del, FNP for planned Left L5-S1 Lumbar Interlaminar epidural steroid injection with fluoroscopic guidance.  The patient has failed conservative care including home exercise, medications, time and activity modification.  This injection will be diagnostic and hopefully therapeutic.  Please see requesting physician notes for further details and justification.   ROS Otherwise per HPI.  Assessment & Plan: Visit Diagnoses:    ICD-10-CM   1. Radiculopathy, lumbar region  M54.16 XR C-ARM NO REPORT    methylPREDNISolone acetate (DEPO-MEDROL) injection 80 mg    Epidural Steroid injection      Plan: No additional findings.   Meds & Orders:  Meds ordered this encounter  Medications   methylPREDNISolone acetate (DEPO-MEDROL) injection 80 mg    Orders Placed This Encounter  Procedures   XR C-ARM NO REPORT   Epidural Steroid injection    Follow-up: Return if symptoms worsen or fail to improve.   Procedures: No procedures performed  Lumbar Epidural Steroid Injection - Interlaminar Approach with Fluoroscopic Guidance  Patient: Ashlee Booth      Date of Birth: May 20, 1981 MRN: [id number] PCP: Ashlee Kotyk, NP      Visit Date: 06/30/2022   Universal Protocol:     Consent Given By: the patient  Position: PRONE  Additional Comments: Vital signs were monitored before and after the procedure. Patient was prepped and draped in the usual sterile fashion. The correct patient, procedure, and site was verified.   Injection Procedure Details:   Procedure diagnoses: Radiculopathy, lumbar region  [M54.16]   Meds Administered:  Meds ordered this encounter  Medications   methylPREDNISolone acetate (DEPO-MEDROL) injection 80 mg     Laterality: Left  Location/Site:  L5-S1  Needle: 3.5 in., 20 ga. Tuohy  Needle Placement: Paramedian epidural  Findings:   -Comments: Excellent flow of contrast into the epidural space.  Procedure Details: Using a paramedian approach from the side mentioned above, the region overlying the inferior lamina was localized under fluoroscopic visualization and the soft tissues overlying this structure were infiltrated with 4 ml. of 1% Lidocaine without Epinephrine. The Tuohy needle was inserted into the epidural space using a paramedian approach.   The epidural space was localized using loss of resistance along with counter oblique bi-planar fluoroscopic views.  After negative aspirate for air, blood, and CSF, a 2 ml. volume of Isovue-250 was injected into the epidural space and the flow of contrast was observed. Radiographs were obtained for documentation purposes.    The injectate was administered into the level noted above.   Additional Comments:  The patient tolerated the procedure well Dressing: 2 x 2 sterile gauze and Band-Aid    Post-procedure details: Patient was observed during the procedure. Post-procedure instructions were reviewed.  Patient left the clinic in stable condition.   Clinical History: No specialty comments available.     Objective:  VS:  HT:    WT:   BMI:     BP:   HR: bpm  TEMP: ( )  RESP:  Physical Exam Vitals and nursing note reviewed.  Constitutional:  General: She is not in acute distress.    Appearance: Normal appearance. She is not ill-appearing.  HENT:     Head: Normocephalic and atraumatic.     Right Ear: External ear normal.     Left Ear: External ear normal.  Eyes:     Extraocular Movements: Extraocular movements intact.  Cardiovascular:     Rate and Rhythm: Normal rate.     Pulses: Normal  pulses.  Pulmonary:     Effort: Pulmonary effort is normal. No respiratory distress.  Abdominal:     General: There is no distension.     Palpations: Abdomen is soft.  Musculoskeletal:        General: Tenderness present.     Cervical back: Neck supple.     Right lower leg: No edema.     Left lower leg: No edema.     Comments: Patient has good distal strength with no pain over the greater trochanters.  No clonus or focal weakness.  Skin:    Findings: No erythema, lesion or rash.  Neurological:     General: No focal deficit present.     Mental Status: She is alert and oriented to person, place, and time.     Sensory: No sensory deficit.     Motor: No weakness or abnormal muscle tone.     Coordination: Coordination normal.  Psychiatric:        Mood and Affect: Mood normal.        Behavior: Behavior normal.      Imaging: No results found.

## 2022-07-12 NOTE — Procedures (Signed)
Lumbar Epidural Steroid Injection - Interlaminar Approach with Fluoroscopic Guidance  Patient: Ashlee Booth      Date of Birth: September 01, 1981 MRN: [id number] PCP: Vonna Kotyk, NP      Visit Date: 06/30/2022   Universal Protocol:     Consent Given By: the patient  Position: PRONE  Additional Comments: Vital signs were monitored before and after the procedure. Patient was prepped and draped in the usual sterile fashion. The correct patient, procedure, and site was verified.   Injection Procedure Details:   Procedure diagnoses: Radiculopathy, lumbar region [M54.16]   Meds Administered:  Meds ordered this encounter  Medications   methylPREDNISolone acetate (DEPO-MEDROL) injection 80 mg     Laterality: Left  Location/Site:  L5-S1  Needle: 3.5 in., 20 ga. Tuohy  Needle Placement: Paramedian epidural  Findings:   -Comments: Excellent flow of contrast into the epidural space.  Procedure Details: Using a paramedian approach from the side mentioned above, the region overlying the inferior lamina was localized under fluoroscopic visualization and the soft tissues overlying this structure were infiltrated with 4 ml. of 1% Lidocaine without Epinephrine. The Tuohy needle was inserted into the epidural space using a paramedian approach.   The epidural space was localized using loss of resistance along with counter oblique bi-planar fluoroscopic views.  After negative aspirate for air, blood, and CSF, a 2 ml. volume of Isovue-250 was injected into the epidural space and the flow of contrast was observed. Radiographs were obtained for documentation purposes.    The injectate was administered into the level noted above.   Additional Comments:  The patient tolerated the procedure well Dressing: 2 x 2 sterile gauze and Band-Aid    Post-procedure details: Patient was observed during the procedure. Post-procedure instructions were reviewed.  Patient left the clinic in stable  condition.

## 2022-07-17 ENCOUNTER — Other Ambulatory Visit: Payer: BC Managed Care – PPO

## 2022-07-17 ENCOUNTER — Ambulatory Visit
Admission: RE | Admit: 2022-07-17 | Discharge: 2022-07-17 | Disposition: A | Discharge status: Home / Self Care | Payer: BC Managed Care – PPO | Source: Ambulatory Visit | Attending: Physician Assistant | Admitting: Physician Assistant

## 2022-07-17 DIAGNOSIS — M5416 Radiculopathy, lumbar region: Secondary | ICD-10-CM

## 2022-07-20 ENCOUNTER — Ambulatory Visit: Payer: BC Managed Care – PPO | Admitting: Physical Medicine and Rehabilitation

## 2022-07-27 ENCOUNTER — Ambulatory Visit: Payer: BC Managed Care – PPO | Admitting: Rehabilitative and Restorative Service Providers"

## 2022-11-19 ENCOUNTER — Ambulatory Visit (HOSPITAL_COMMUNITY)
Admission: RE | Admit: 2022-11-19 | Discharge: 2022-11-19 | Disposition: A | Discharge status: Home / Self Care | Payer: BC Managed Care – PPO | Source: Ambulatory Visit | Attending: Physician Assistant | Admitting: Physician Assistant

## 2022-11-19 ENCOUNTER — Encounter: Payer: Self-pay | Admitting: Physician Assistant

## 2022-11-19 ENCOUNTER — Ambulatory Visit (INDEPENDENT_AMBULATORY_CARE_PROVIDER_SITE_OTHER): Payer: BC Managed Care – PPO | Admitting: Physician Assistant

## 2022-11-19 DIAGNOSIS — M79604 Pain in right leg: Secondary | ICD-10-CM | POA: Insufficient documentation

## 2022-11-19 DIAGNOSIS — I82401 Acute embolism and thrombosis of unspecified deep veins of right lower extremity: Secondary | ICD-10-CM

## 2022-11-19 MED ORDER — METHYLPREDNISOLONE 4 MG PO TBPK: ORAL_TABLET | ORAL | 0 refills | Status: DC

## 2022-11-19 NOTE — Progress Notes (Signed)
Office Visit Note   Patient: Ashlee Booth           Date of Birth: 1981-10-25           MRN: [id number] Visit Date: 11/19/2022              Requested by: Vonna Kotyk, NP [address] [place],  [place] [address] PCP: Vonna Kotyk, NP  No chief complaint on file.     HPI: Ashlee Booth is a pleasant active 41 year old woman with a chief complaint of pain running down her right leg from her posterior thigh down to her foot with some associated paresthesias.  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  She has been seen for this last summer when she actually had symptoms in both legs.  She did undergo a course of Medrol taper which did not really help her very much.  She was then referred to Dr. Alvester Morin for a L5-S1 ESI.  She did have a steroid flare and had significantly more pain initially.  She does admit that about a week later she did have good relief.  This relief lasted until about October or November and now she has had the return of pain in her right leg.  She also describes some tightness in her right calf and pain.  No loss of bowel or bladder control  Assessment & Plan: Visit Diagnoses:  1. Acute deep vein thrombosis (DVT) of right lower extremity, unspecified vein (HCC)   2. Pain in right leg     Plan: Her MRI is fairly benign.  This was done after her injection.  She has an intact neurologic exam today.  I will try 1 more steroid Dosepak.  She may benefit from physical therapy.  She is not interested in another steroid injection.  Will have her follow-up with Dr. Cleophas Dunker and myself next week for another opinion considering her fairly normal MRI  Follow-Up Instructions: Return in about 1 week (around 11/26/2022).   Ortho Exam  Patient is alert, oriented, no adenopathy, well-dressed, normal affect, normal respiratory effort. She appears well no muscle atrophy she is ambulating without difficulty.  She has a negative Homans'  sign but does have pain to deep palpation over the right calf.  She has 5 out of 5 strength with resisted dorsiflexion plantarflexion of her ankles extension and flexion of her legs and hips.  No tenderness to palpation over back.  No tenderness with rotation of her hip compartments are soft  Imaging: No results found. No images are attached to the encounter.  Labs: Lab Results  Component Value Date   REPTSTATUS 03/21/2019 FINAL 03/20/2019   CULT  03/20/2019    NO GROWTH Performed at [facility], [address] [place], [place] [address]      Lab Results  Component Value Date   ALBUMIN 4.3 03/29/2021   ALBUMIN 4.2 03/12/2021   ALBUMIN 4.4 03/20/2019    No results found for: "MG" No results found for: "VD25OH"  No results found for: "PREALBUMIN"    Latest Ref Rng & Units 04/08/2021    8:18 AM 03/29/2021    8:40 PM 03/12/2021    1:02 PM  CBC EXTENDED  WBC 4.0 - 10.5 K/uL 3.7  7.1  4.7   RBC 3.87 - 5.11 MIL/uL 4.68  5.05  4.87   Hemoglobin 12.0 - 15.0 g/dL 86.5  78.4  69.6   HCT 36.0 -  46.0 % 42.4  45.4  43.5   Platelets 150 - 400 K/uL 246  251  230   NEUT# 1.7 - 7.7 K/uL 1.7  3.6    Lymph# 0.7 - 4.0 K/uL 1.3  2.6       There is no height or weight on file to calculate BMI.  Orders:  Orders Placed This Encounter  Procedures   VAS [place] LOWER EXTREMITY VENOUS (DVT)   Meds ordered this encounter  Medications   methylPREDNISolone (MEDROL DOSEPAK) 4 MG TBPK tablet    Sig: Take as directed    Dispense:  21 tablet    Refill:  0     Procedures: No procedures performed  Clinical Data: No additional findings.  ROS:  All other systems negative, except as noted in the HPI. Review of Systems  All other systems reviewed and are negative.   Objective: Vital Signs: There were no vitals taken for this visit.  Specialty Comments:  EXAM: MRI LUMBAR SPINE WITHOUT CONTRAST   TECHNIQUE: Multiplanar, multisequence MR imaging of the lumbar spine was performed.  No intravenous contrast was administered.   COMPARISON:  None Available.   FINDINGS: Segmentation: 5 lumbar type vertebrae based on the available coverage   Alignment:  Normal   Vertebrae:  No fracture, evidence of discitis, or bone lesion.   Conus medullaris and cauda equina: Conus extends to the L2 level. Conus and cauda equina appear normal.   Paraspinal and other soft tissues: Right hepatic cystic intensity, small and non worrisome.   Disc levels:   Diffusely preserved disc height and hydration. T2 hyperintensity posterior and lateral to the right facet which appears distinct from it and is likely small lymphangioma or varix. No noted facet degeneration. No neural impingement.   IMPRESSION: Negative for neural impingement or visible inflammation.     Electronically Signed   By: Jorje Guild M.D.   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 No pertinent family history.  Past Surgical History:  Procedure Laterality Date   TUBAL LIGATION     Social History   Occupational History   Not on file  Tobacco Use   Smoking status: Never   Smokeless tobacco: Never  Vaping Use   Vaping Use: Never used  Substance and Sexual Activity   Alcohol use: Yes   Drug use: No   Sexual activity: Not on file

## 2023-01-03 ENCOUNTER — Emergency Department (HOSPITAL_BASED_OUTPATIENT_CLINIC_OR_DEPARTMENT_OTHER)
Admission: EM | Admit: 2023-01-03 | Discharge: 2023-01-03 | Disposition: A | Discharge status: Home / Self Care | Payer: BC Managed Care – PPO | Attending: Emergency Medicine | Admitting: Emergency Medicine

## 2023-01-03 ENCOUNTER — Other Ambulatory Visit: Payer: Self-pay

## 2023-01-03 ENCOUNTER — Encounter (HOSPITAL_BASED_OUTPATIENT_CLINIC_OR_DEPARTMENT_OTHER): Payer: Self-pay

## 2023-01-03 DIAGNOSIS — R3129 Other microscopic hematuria: Secondary | ICD-10-CM | POA: Insufficient documentation

## 2023-01-03 DIAGNOSIS — R1012 Left upper quadrant pain: Secondary | ICD-10-CM | POA: Diagnosis not present

## 2023-01-03 DIAGNOSIS — R109 Unspecified abdominal pain: Secondary | ICD-10-CM | POA: Diagnosis present

## 2023-01-03 DIAGNOSIS — R319 Hematuria, unspecified: Secondary | ICD-10-CM | POA: Diagnosis not present

## 2023-01-03 LAB — PREGNANCY, URINE: Preg Test, Ur: NEGATIVE

## 2023-01-03 LAB — CBC
HCT: 42.7 % (ref 36.0–46.0)
Hemoglobin: 14.1 g/dL (ref 12.0–15.0)
MCH: 29.7 pg (ref 26.0–34.0)
MCHC: 33 g/dL (ref 30.0–36.0)
MCV: 89.9 fL (ref 80.0–100.0)
Platelets: 235 10*3/uL (ref 150–400)
RBC: 4.75 MIL/uL (ref 3.87–5.11)
RDW: 12.5 % (ref 11.5–15.5)
WBC: 6.3 10*3/uL (ref 4.0–10.5)
nRBC: 0 % (ref 0.0–0.2)

## 2023-01-03 LAB — URINALYSIS, MICROSCOPIC (REFLEX)

## 2023-01-03 LAB — COMPREHENSIVE METABOLIC PANEL
ALT: 14 U/L (ref 0–44)
AST: 18 U/L (ref 15–41)
Albumin: 4.1 g/dL (ref 3.5–5.0)
Alkaline Phosphatase: 54 U/L (ref 38–126)
Anion gap: 7 (ref 5–15)
BUN: 11 mg/dL (ref 6–20)
CO2: 28 mmol/L (ref 22–32)
Calcium: 8.9 mg/dL (ref 8.9–10.3)
Chloride: 103 mmol/L (ref 98–111)
Creatinine, Ser: 0.86 mg/dL (ref 0.44–1.00)
GFR, Estimated: >60 mL/min (ref >60)
Glucose, Bld: 116 mg/dL — ABNORMAL HIGH (ref 70–99)
Potassium: 3.7 mmol/L (ref 3.5–5.1)
Sodium: 138 mmol/L (ref 135–145)
Total Bilirubin: 0.4 mg/dL (ref 0.3–1.2)
Total Protein: 7.2 g/dL (ref 6.5–8.1)

## 2023-01-03 LAB — URINALYSIS, ROUTINE W REFLEX MICROSCOPIC
Bilirubin Urine: NEGATIVE
Glucose, UA: NEGATIVE mg/dL
Ketones, ur: NEGATIVE mg/dL
Leukocytes,Ua: NEGATIVE
Nitrite: NEGATIVE
Protein, ur: NEGATIVE mg/dL
Specific Gravity, Urine: 1.015 (ref 1.005–1.030)
pH: 6 (ref 5.0–8.0)

## 2023-01-03 LAB — LIPASE, BLOOD: Lipase: 36 U/L (ref 11–51)

## 2023-01-03 MED ORDER — DICYCLOMINE HCL 20 MG PO TABS: 20.0000 mg | ORAL_TABLET | Freq: Three times a day (TID) | ORAL | 0 refills | Status: DC | PRN

## 2023-01-03 NOTE — Discharge Instructions (Signed)
Thank you for letting us take care of you today.  Your labs, urine, vital signs, and physical exam were reassuring and we did not find an emergent cause for your abdominal pain. I recommend you see your PCP within the next week for follow up especially if you continue to have pain. They may want to run more tests or refer you to a GI specialist (or stomach doctor). I prescribed a medication for you to take in the meantime to help with your pain. Please take this only as prescribed.  If you develop worsening pain or new symptoms such as fever, vomiting, or other concerns, please return to nearest emergency department for re-evaluation.

## 2023-01-03 NOTE — ED Provider Notes (Signed)
[facility] Provider Note   CSN: [id number] Arrival date & time: 01/03/23  1717     History  Chief Complaint  Patient presents with   Abdominal Pain    Ashlee Booth is a 42 y.o. female with no PMH presenting to ED c/o diffuse left sided abdominal cramping for the last month. Pt states pain comes and goes and seems to be worse when she is lying down at night and with menses. No worsening pain with eating or moving. She is tolerating PO. She states she has had similar pain before with menses and she is currently due for her menstrual cycle but it has not yet started. No nausea, vomiting, diarrhea, constipation, vaginal discharge, dysuria, hematuria, chest pain, shortness of breath, fever, chills, or other concerns. Pt has tried ibuprofen at home without relief. She denies concern for STD exposure and does not desire testing.     Home Medications Prior to Admission medications   Medication Sig Start Date End Date Taking? Authorizing Provider  dicyclomine (BENTYL) 20 MG tablet Take 1 tablet (20 mg total) by mouth 3 (three) times daily as needed for up to 5 days for spasms. 01/03/23 01/08/23 Yes Claus Silvestro L, PA-C  busPIRone (BUSPAR) 10 MG tablet Take by mouth. 03/09/21   [provider]  famotidine (PEPCID) 20 MG tablet Take 20 mg by mouth 2 (two) times daily.    [provider]  ibuprofen (ADVIL) 400 MG tablet Take 1 tablet (400 mg total) by mouth every 6 (six) hours as needed. 03/03/21   Palumbo, April, MD  methylPREDNISolone (MEDROL DOSEPAK) 4 MG TBPK tablet Take as directed 11/19/22   Persons, Bevely Palmer, PA  predniSONE (DELTASONE) 10 MG tablet Take 1 tablet (10 mg total) by mouth daily with breakfast. 05/25/22   Suzan Slick, NP      Allergies    Patient has no known allergies.    Review of Systems   Review of Systems  Constitutional:  Negative for activity change, appetite change, chills, fever and unexpected weight  change.  HENT:  Negative for congestion, ear pain, rhinorrhea, sore throat and trouble swallowing.   Eyes:  Negative for pain and visual disturbance.  Respiratory:  Negative for cough and shortness of breath.   Cardiovascular:  Negative for chest pain and palpitations.  Gastrointestinal:  Positive for abdominal pain. Negative for blood in stool, constipation, diarrhea, nausea and vomiting.  Genitourinary:  Negative for decreased urine volume, difficulty urinating, dysuria, flank pain, frequency, genital sores, hematuria, pelvic pain, urgency, vaginal bleeding, vaginal discharge and vaginal pain.  Musculoskeletal:  Negative for arthralgias, back pain and joint swelling.  Skin:  Negative for color change and rash.  Neurological:  Negative for dizziness, seizures, syncope, weakness, light-headedness and headaches.  All other systems reviewed and are negative.   Physical Exam Updated Vital Signs BP 116/82 (BP Location: Left Arm)   Pulse 77   Temp 98.5 F (36.9 C) (Oral)   Resp 16   Ht 5\' 8"  (1.727 m)   Wt 65.8 kg   SpO2 99%   BMI 22.05 kg/m  Physical Exam Vitals and nursing note reviewed.  Constitutional:      General: She is not in acute distress.    Appearance: Normal appearance. She is not ill-appearing or toxic-appearing.  HENT:     Head: Normocephalic and atraumatic.     Mouth/Throat:     Mouth: Mucous membranes are moist.  Eyes:  General: No scleral icterus.    Extraocular Movements: Extraocular movements intact.     Conjunctiva/sclera: Conjunctivae normal.  Cardiovascular:     Rate and Rhythm: Normal rate and regular rhythm.     Heart sounds: No murmur heard. Pulmonary:     Effort: Pulmonary effort is normal.     Breath sounds: Normal breath sounds.  Abdominal:     General: Abdomen is flat. Bowel sounds are normal. There is no distension.     Palpations: Abdomen is soft. There is no fluid wave, hepatomegaly, splenomegaly, mass or pulsatile mass.     Tenderness:  There is abdominal tenderness (mild LUQ, remainder of abdomen non-tender). There is no right CVA tenderness, left CVA tenderness, guarding or rebound. Negative signs include Murphy's sign, Rovsing's sign and McBurney's sign.     Hernia: No hernia is present.  Musculoskeletal:        General: Normal range of motion.     Cervical back: Neck supple.     Right lower leg: No edema.     Left lower leg: No edema.  Skin:    General: Skin is warm and dry.     Capillary Refill: Capillary refill takes less than 2 seconds.  Neurological:     Mental Status: She is alert. Mental status is at baseline.  Psychiatric:        Mood and Affect: Mood normal.        Behavior: Behavior normal.     ED Results / Procedures / Treatments   Labs (all labs ordered are listed, but only abnormal results are displayed) Labs Reviewed  COMPREHENSIVE METABOLIC PANEL - Abnormal; Notable for the following components:      Result Value   Glucose, Bld 116 (*)    All other components within normal limits  URINALYSIS, ROUTINE W REFLEX MICROSCOPIC - Abnormal; Notable for the following components:   Hgb urine dipstick SMALL (*)    All other components within normal limits  URINALYSIS, MICROSCOPIC (REFLEX) - Abnormal; Notable for the following components:   Bacteria, UA RARE (*)    All other components within normal limits  LIPASE, BLOOD  CBC  PREGNANCY, URINE    EKG None  Radiology No results found.  Procedures Procedures   Medications Ordered in ED Medications - No data to display  ED Course/ Medical Decision Making/ A&P                           Medical Decision Making Amount and/or Complexity of Data Reviewed Labs: ordered. Decision-making details documented in ED Course.  Risk Prescription drug management.   42 year old female presenting to ED for evaluation of left sided abdominal pain. The differential for abdominal pain includes but is not limited to appendicitis, diverticulitis, DKA,  gastritis, gastroenteritis, nephrolithiasis, pancreatitis, peritonitis, constipation, UTI, SBO/LBO, splenic rupture, biliary disease, IBD, IBS, PUD, hepatitis, ectopic pregnancy, ovarian torsion, PID, dysmenorrhea. Pt non-toxic appearing and with unremarkable exam apart from LUQ abdominal tenderness. Ibuprofen has not been helpful at home. No fever, no tachycardia, no peritoneal signs to suggest acute abdomen. Pregnancy test is negative. Pt declined vaginal discharge or concern for STIs and with location low suspicion for PID or other pelvic emergency. She does have mild microscopic hematuria but no flank pain, no severe pain, no urinary symptoms, no nausea or vomiting, no CVA tenderness, no decreased urination so low suspicion for nephrolithiasis/ureterolithiasis, UTI/pyelonephritis. Overall, workup is reassuring. Pt reports long-standing history of dysmenorrhea and  is due for menstrual cycle and with description of pain as a cramping suspect this may be contributing to symptoms. Pt does not desire further workup and I believe this is reasonable given exam and findings thus far in visit. Discussed plan to discharge home with Bentyl for symptomatic relief and recommend close follow up with PCP (who manages her regular GYN care) should symptoms continue or worsen. Strict ED return precautions given for worsening pain, fever, nausea/vomiting, or other new symptoms or concerns. All questions answered and pt stable for discharge.    Final Clinical Impression(s) / ED Diagnoses Final diagnoses:  Left upper quadrant abdominal pain  Microscopic hematuria    Rx / DC Orders ED Discharge Orders          Ordered    dicyclomine (BENTYL) 20 MG tablet  3 times daily PRN        01/03/23 1840              Tonette Lederer, PA-C 01/05/23 1440    Linwood Dibbles, MD 01/07/23 1701

## 2023-01-03 NOTE — ED Triage Notes (Signed)
States has had left lower abdominal pain x 1 month, states last month was having vaginal bleeding all month. Now supposed to get period but hasn't gotten it yet. Denies urinary symptoms, N/V/D.

## 2023-01-17 ENCOUNTER — Ambulatory Visit (INDEPENDENT_AMBULATORY_CARE_PROVIDER_SITE_OTHER): Payer: BC Managed Care – PPO | Admitting: Orthopedic Surgery

## 2023-01-17 ENCOUNTER — Ambulatory Visit (INDEPENDENT_AMBULATORY_CARE_PROVIDER_SITE_OTHER): Payer: BC Managed Care – PPO

## 2023-01-17 DIAGNOSIS — M25511 Pain in right shoulder: Secondary | ICD-10-CM | POA: Diagnosis not present

## 2023-01-19 ENCOUNTER — Encounter: Payer: Self-pay | Admitting: Orthopedic Surgery

## 2023-01-19 ENCOUNTER — Other Ambulatory Visit: Payer: Self-pay

## 2023-01-19 DIAGNOSIS — M25511 Pain in right shoulder: Secondary | ICD-10-CM

## 2023-01-19 NOTE — Progress Notes (Unsigned)
Office Visit Note   Patient: Ashlee Booth           Date of Birth: Jun 04, 1981           MRN: [id number] Visit Date: 01/17/2023 Requested by: Finis Bud, NP [address],  [place] [address] PCP: Finis Bud, NP  Subjective: Chief Complaint  Patient presents with   Right Shoulder - Pain    HPI: Joliene Salvador is a 42 y.o. female who presents to the office reporting right shoulder pain of several weeks duration.  Denies any history of injury.  She describes focal atrophy and skin pigment changes in the area of concern around the posterior lateral deltoid on the right shoulder.  Does not really report significant pain but there is mild pain present.  Her bigger issue is the weakness.  The symptoms do not wake her from sleep at night.  She does have some radicular symptoms going to the hand.  Denies any discrete neck pain.  Has some numbness and tingling in the arm and hand.  No scapular pain.  She does work at [employer] with max weight less than or equal to 50 pounds.  She states that her arm feels weak and it tingles occasionally..                ROS: All systems reviewed are negative as they relate to the chief complaint within the history of present illness.  Patient denies fevers or chills.  Assessment & Plan: Visit Diagnoses:  1. Right shoulder pain, unspecified chronicity     Plan: Impression is focal muscle atrophy in the right shoulder girdle region associated with skin depigmentation.  Radiographs are unremarkable and ultrasound examination of the area does also not help with diagnosis.  Could be radicular in nature but she is really having no neck pain.  The focal area of atrophy could be muscular or fat necrosis.  The depigmentation in the skin is also unusual.  MRI with contrast of that distal deltoid region of the shoulder is indicated to evaluate this area of focal atrophy and skin depigmentation.  Follow-up after that study.  Follow-Up Instructions:  No follow-ups on file.   Orders:  Orders Placed This Encounter  Procedures   XR Shoulder Right   No orders of the defined types were placed in this encounter.     Procedures: No procedures performed   Clinical Data: No additional findings.  Objective: Vital Signs: There were no vitals taken for this visit.  Physical Exam:  Constitutional: Patient appears well-developed HEENT:  Head: Normocephalic Eyes:EOM are normal Neck: Normal range of motion Cardiovascular: Normal rate Pulmonary/chest: Effort normal Neurologic: Patient is alert Skin: Skin is warm Psychiatric: Patient has normal mood and affect  Ortho Exam: Ortho exam demonstrates good cervical spine range of motion.  5 out of 5 grip EPL FPL interosseous resection extension bicep triceps and deltoid strength.  No significant coarseness or grinding with internal/external rotation of that right arm.  Rotator cuff strength intact.  Slightly weak with abduction on the right versus left.  7 x 7 cm area of depigmentation noted just proximal to the level of the deltoid insertion but on the posterior aspect of the native deltoid.  No discrete AC joint tenderness is present.  No lymphadenopathy present.  Do not palpate any discrete masses in this concave area on the right shoulder compared to the left.  Specialty Comments:  EXAM: MRI LUMBAR SPINE WITHOUT CONTRAST   TECHNIQUE:  Multiplanar, multisequence MR imaging of the lumbar spine was performed. No intravenous contrast was administered.   COMPARISON:  None Available.   FINDINGS: Segmentation: 5 lumbar type vertebrae based on the available coverage   Alignment:  Normal   Vertebrae:  No fracture, evidence of discitis, or bone lesion.   Conus medullaris and cauda equina: Conus extends to the L2 level. Conus and cauda equina appear normal.   Paraspinal and other soft tissues: Right hepatic cystic intensity, small and non worrisome.   Disc levels:   Diffusely  preserved disc height and hydration. T2 hyperintensity posterior and lateral to the right facet which appears distinct from it and is likely small lymphangioma or varix. No noted facet degeneration. No neural impingement.   IMPRESSION: Negative for neural impingement or visible inflammation.     Electronically Signed   By: Jorje Guild M.D.   On: 07/19/2022 08:26  Imaging: No results found.   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 No pertinent family history.  Past Surgical History:  Procedure Laterality Date   TUBAL LIGATION     Social History   Occupational History   Not on file  Tobacco Use   Smoking status: Never   Smokeless tobacco: Never  Vaping Use   Vaping Use: Never used  Substance and Sexual Activity   Alcohol use: Yes   Drug use: No   Sexual activity: Not on file

## 2023-01-20 ENCOUNTER — Encounter: Payer: Self-pay | Admitting: Orthopedic Surgery

## 2023-02-07 ENCOUNTER — Ambulatory Visit: Payer: BC Managed Care – PPO | Admitting: Orthopedic Surgery

## 2023-02-11 ENCOUNTER — Ambulatory Visit
Admission: RE | Admit: 2023-02-11 | Discharge: 2023-02-11 | Disposition: A | Discharge status: Home / Self Care | Payer: BC Managed Care – PPO | Source: Ambulatory Visit | Attending: Orthopedic Surgery | Admitting: Orthopedic Surgery

## 2023-02-11 DIAGNOSIS — M25511 Pain in right shoulder: Secondary | ICD-10-CM | POA: Diagnosis not present

## 2023-02-11 MED ORDER — GADOPICLENOL 0.5 MMOL/ML IV SOLN
7.0000 mL | Freq: Once | INTRAVENOUS | Status: AC | PRN
  Administered 2023-02-11: 7 mL via INTRAVENOUS

## 2023-02-14 ENCOUNTER — Ambulatory Visit: Payer: BC Managed Care – PPO | Admitting: Orthopedic Surgery

## 2023-03-28 ENCOUNTER — Encounter: Payer: Self-pay | Admitting: *Deleted

## 2024-01-16 NOTE — H&P (Incomplete)
Evalyn Shultis is an 43 y.o. No obstetric history on file. who is admitted for ***.  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   MEDICAL/FAMILY/SOCIAL HX: No LMP recorded.    Past Medical History:  Diagnosis Date   Herpes genitalia     Past Surgical History:  Procedure Laterality Date   TUBAL LIGATION      No family history on file.  Social History:  reports that she has never smoked. She has never used smokeless tobacco. She reports current alcohol use. She reports that she does not use drugs.  ALLERGIES/MEDS:  Allergies: No Known Allergies  No medications prior to admission.     ROS  There were no vitals taken for this visit. Physical Exam  No results found for this or any previous visit (from the past 24 hours).  No results found.   ASSESSMENT/PLAN: Bayle Calvo is a 43 y.o. No obstetric history on file. who is admitted for ***   [name], DO

## 2024-01-31 ENCOUNTER — Encounter (HOSPITAL_COMMUNITY): Payer: Self-pay | Admitting: Obstetrics and Gynecology

## 2024-01-31 ENCOUNTER — Other Ambulatory Visit: Payer: Self-pay

## 2024-01-31 NOTE — Progress Notes (Signed)
PCP - Angeline Slim, NP Cardiologist - denies  PPM/ICD - denies   Chest x-ray - 11/27/17 EKG - 04/08/21 (n/a) Stress Test - denies ECHO - denies Cardiac Cath - denies  CPAP - denies  DM- denies  ASA/Blood Thinner Instructions: n/a   ERAS Protcol - clears until 1000  COVID TEST- n/a  Anesthesia review: no  Patient verbally denies any shortness of breath, fever, cough and chest pain during phone call     Questions were answered. Patient verbalized understanding of instructions.

## 2024-01-31 NOTE — Pre-Procedure Instructions (Signed)
-------------    SDW INSTRUCTIONS given:  Your procedure is scheduled on 2/19.  Report to [facility] Main Entrance "A" at 10:30 A.M., and check in at the Admitting office.  Any questions or running late day of surgery: call [phone]    Remember:  Do not eat after midnight the night before your surgery  You may drink clear liquids until 10:00 AM the morning of your surgery.   Clear liquids allowed are: Water, Non-Citrus Juices (without pulp), Carbonated Beverages, Clear Tea, Black Coffee Only, and Gatorade    Take these medicines the morning of surgery with A SIP OF WATER: NONE   As of today, STOP taking any Aspirin (unless otherwise instructed by your surgeon) Aleve, Naproxen, Ibuprofen, Motrin, Advil, Goody's, BC's, all herbal medications, fish oil, and all vitamins.   Do NOT Smoke (Tobacco/Vaping) 24 hours prior to your procedure  If you use a CPAP at night, you may bring all equipment for your overnight stay.     You will be asked to remove any contacts, glasses, piercing's, hearing aid's, dentures/partials prior to surgery. Please bring cases for these items if needed.     Patients discharged the day of surgery will not be allowed to drive home, and someone needs to stay with them for 24 hours.  SURGICAL WAITING ROOM VISITATION Patients may have no more than 2 support people in the waiting area - these visitors may rotate.   Pre-op nurse will coordinate an appropriate time for 1 ADULT support person, who may not rotate, to accompany patient in pre-op.  Children under the age of 29 must have an adult with them who is not the patient and must remain in the main waiting area with an adult.  If the patient needs to stay at the hospital during part of their recovery, the visitor guidelines for inpatient rooms apply.  Please refer to the [facility] website for the visitor guidelines for any additional information.   Special instructions:   [facility]- Preparing For  Surgery   Please follow these instructions carefully.   Shower the NIGHT BEFORE SURGERY and the MORNING OF SURGERY with DIAL Soap.   Pat yourself dry with a CLEAN TOWEL.  Wear CLEAN PAJAMAS to bed the night before surgery  Place CLEAN SHEETS on your bed the night of your first shower and DO NOT SLEEP WITH PETS.   Additional instructions for the day of surgery: DO NOT APPLY any lotions, deodorants, cologne, or perfumes.   Do not wear jewelry or makeup Do not wear nail polish, gel polish, artificial nails, or any other type of covering on natural nails (fingers and toes) Do not bring valuables to the hospital. [facility] is not responsible for valuables/personal belongings. Put on clean/comfortable clothes.  Please brush your teeth.  Ask your nurse before applying any prescription medications to the skin.

## 2024-02-01 ENCOUNTER — Ambulatory Visit (HOSPITAL_COMMUNITY)
Admission: RE | Admit: 2024-02-01 | Discharge: 2024-02-01 | Disposition: A | Discharge status: Home / Self Care | Payer: BC Managed Care – PPO | Attending: Obstetrics and Gynecology | Admitting: Obstetrics and Gynecology

## 2024-02-01 ENCOUNTER — Ambulatory Visit (HOSPITAL_COMMUNITY): Payer: Self-pay | Admitting: Anesthesiology

## 2024-02-01 ENCOUNTER — Other Ambulatory Visit: Payer: Self-pay

## 2024-02-01 ENCOUNTER — Encounter (HOSPITAL_COMMUNITY): Payer: Self-pay | Admitting: Obstetrics and Gynecology

## 2024-02-01 ENCOUNTER — Encounter (HOSPITAL_COMMUNITY)
Admission: RE | Disposition: A | Discharge status: Home / Self Care | Payer: Self-pay | Source: Home / Self Care | Attending: Obstetrics and Gynecology

## 2024-02-01 DIAGNOSIS — N939 Abnormal uterine and vaginal bleeding, unspecified: Secondary | ICD-10-CM | POA: Insufficient documentation

## 2024-02-01 DIAGNOSIS — N840 Polyp of corpus uteri: Secondary | ICD-10-CM | POA: Insufficient documentation

## 2024-02-01 DIAGNOSIS — R9389 Abnormal findings on diagnostic imaging of other specified body structures: Secondary | ICD-10-CM | POA: Insufficient documentation

## 2024-02-01 DIAGNOSIS — N9489 Other specified conditions associated with female genital organs and menstrual cycle: Secondary | ICD-10-CM

## 2024-02-01 HISTORY — PX: DILATATION & CURETTAGE/HYSTEROSCOPY WITH MYOSURE: SHX6511

## 2024-02-01 LAB — ABO/RH: ABO/RH(D): A POS

## 2024-02-01 LAB — CBC
HCT: 41.5 % (ref 36.0–46.0)
Hemoglobin: 14.3 g/dL (ref 12.0–15.0)
MCH: 30.6 pg (ref 26.0–34.0)
MCHC: 34.5 g/dL (ref 30.0–36.0)
MCV: 88.9 fL (ref 80.0–100.0)
Platelets: 241 10*3/uL (ref 150–400)
RBC: 4.67 MIL/uL (ref 3.87–5.11)
RDW: 12.3 % (ref 11.5–15.5)
WBC: 4.9 10*3/uL (ref 4.0–10.5)
nRBC: 0 % (ref 0.0–0.2)

## 2024-02-01 LAB — TYPE AND SCREEN
ABO/RH(D): A POS
Antibody Screen: NEGATIVE

## 2024-02-01 LAB — POCT PREGNANCY, URINE: Preg Test, Ur: NEGATIVE

## 2024-02-01 SURGERY — DILATATION & CURETTAGE/HYSTEROSCOPY WITH MYOSURE
Anesthesia: General | Site: Uterus

## 2024-02-01 MED ORDER — EPHEDRINE 5 MG/ML INJ
INTRAVENOUS | Status: AC
  Filled 2024-02-01: qty 5

## 2024-02-01 MED ORDER — PROPOFOL 10 MG/ML IV BOLUS
INTRAVENOUS | Status: DC | PRN
  Administered 2024-02-01: 150 mg via INTRAVENOUS

## 2024-02-01 MED ORDER — ORAL CARE MOUTH RINSE: 15.0000 mL | Freq: Once | OROMUCOSAL | Status: AC

## 2024-02-01 MED ORDER — DEXMEDETOMIDINE HCL IN NACL 80 MCG/20ML IV SOLN
INTRAVENOUS | Status: DC | PRN
  Administered 2024-02-01: 12 ug via INTRAVENOUS

## 2024-02-01 MED ORDER — FENTANYL CITRATE (PF) 100 MCG/2ML IJ SOLN
INTRAMUSCULAR | Status: DC | PRN
  Administered 2024-02-01: 50 ug via INTRAVENOUS

## 2024-02-01 MED ORDER — ACETAMINOPHEN 500 MG PO TABS
1000.0000 mg | ORAL_TABLET | Freq: Once | ORAL | Status: AC
  Administered 2024-02-01: 1000 mg via ORAL
  Filled 2024-02-01: qty 2

## 2024-02-01 MED ORDER — ONDANSETRON HCL 4 MG/2ML IJ SOLN
INTRAMUSCULAR | Status: DC | PRN
  Administered 2024-02-01: 4 mg via INTRAVENOUS

## 2024-02-01 MED ORDER — CHLORHEXIDINE GLUCONATE 0.12 % MT SOLN
15.0000 mL | Freq: Once | OROMUCOSAL | Status: AC
  Administered 2024-02-01: 15 mL via OROMUCOSAL
  Filled 2024-02-01: qty 15

## 2024-02-01 MED ORDER — DEXAMETHASONE SODIUM PHOSPHATE 10 MG/ML IJ SOLN
INTRAMUSCULAR | Status: DC | PRN
  Administered 2024-02-01: 10 mg via INTRAVENOUS

## 2024-02-01 MED ORDER — LIDOCAINE 2% (20 MG/ML) 5 ML SYRINGE
INTRAMUSCULAR | Status: DC | PRN
  Administered 2024-02-01: 60 mg via INTRAVENOUS

## 2024-02-01 MED ORDER — PHENYLEPHRINE 80 MCG/ML (10ML) SYRINGE FOR IV PUSH (FOR BLOOD PRESSURE SUPPORT)
PREFILLED_SYRINGE | INTRAVENOUS | Status: DC | PRN
  Administered 2024-02-01 (×2): 160 ug via INTRAVENOUS

## 2024-02-01 MED ORDER — LIDOCAINE 2% (20 MG/ML) 5 ML SYRINGE
INTRAMUSCULAR | Status: AC
  Filled 2024-02-01: qty 5

## 2024-02-01 MED ORDER — PHENYLEPHRINE 80 MCG/ML (10ML) SYRINGE FOR IV PUSH (FOR BLOOD PRESSURE SUPPORT)
PREFILLED_SYRINGE | INTRAVENOUS | Status: AC
  Filled 2024-02-01: qty 10

## 2024-02-01 MED ORDER — KETOROLAC TROMETHAMINE 30 MG/ML IJ SOLN
INTRAMUSCULAR | Status: AC
  Filled 2024-02-01: qty 1

## 2024-02-01 MED ORDER — ONDANSETRON HCL 4 MG/2ML IJ SOLN
INTRAMUSCULAR | Status: AC
  Filled 2024-02-01: qty 2

## 2024-02-01 MED ORDER — SILVER NITRATE-POT NITRATE 75-25 % EX MISC
CUTANEOUS | Status: DC | PRN
  Administered 2024-02-01: 6

## 2024-02-01 MED ORDER — SILVER NITRATE-POT NITRATE 75-25 % EX MISC
CUTANEOUS | Status: AC
  Filled 2024-02-01: qty 10

## 2024-02-01 MED ORDER — PROPOFOL 10 MG/ML IV BOLUS
INTRAVENOUS | Status: AC
  Filled 2024-02-01: qty 20

## 2024-02-01 MED ORDER — MIDAZOLAM HCL 2 MG/2ML IJ SOLN
INTRAMUSCULAR | Status: AC
  Filled 2024-02-01: qty 2

## 2024-02-01 MED ORDER — DEXAMETHASONE SODIUM PHOSPHATE 10 MG/ML IJ SOLN
INTRAMUSCULAR | Status: AC
  Filled 2024-02-01: qty 1

## 2024-02-01 MED ORDER — LACTATED RINGERS IV SOLN: INTRAVENOUS | Status: DC

## 2024-02-01 MED ORDER — FENTANYL CITRATE (PF) 100 MCG/2ML IJ SOLN: 25.0000 ug | INTRAMUSCULAR | Status: DC | PRN

## 2024-02-01 MED ORDER — KETOROLAC TROMETHAMINE 30 MG/ML IJ SOLN
INTRAMUSCULAR | Status: DC | PRN
  Administered 2024-02-01: 30 mg via INTRAVENOUS

## 2024-02-01 MED ORDER — FENTANYL CITRATE (PF) 250 MCG/5ML IJ SOLN
INTRAMUSCULAR | Status: AC
  Filled 2024-02-01: qty 5

## 2024-02-01 MED ORDER — MIDAZOLAM HCL 5 MG/5ML IJ SOLN
INTRAMUSCULAR | Status: DC | PRN
  Administered 2024-02-01: 2 mg via INTRAVENOUS

## 2024-02-01 MED ORDER — OXYCODONE HCL 5 MG/5ML PO SOLN: 5.0000 mg | Freq: Once | ORAL | Status: DC | PRN

## 2024-02-01 MED ORDER — IBUPROFEN 800 MG PO TABS: 800.0000 mg | ORAL_TABLET | Freq: Three times a day (TID) | ORAL | 0 refills | Status: AC | PRN

## 2024-02-01 MED ORDER — OXYCODONE HCL 5 MG PO TABS: 5.0000 mg | ORAL_TABLET | Freq: Once | ORAL | Status: DC | PRN

## 2024-02-01 MED ORDER — AMISULPRIDE (ANTIEMETIC) 5 MG/2ML IV SOLN: 10.0000 mg | Freq: Once | INTRAVENOUS | Status: DC | PRN

## 2024-02-01 MED ORDER — DEXMEDETOMIDINE HCL IN NACL 80 MCG/20ML IV SOLN
INTRAVENOUS | Status: AC
  Filled 2024-02-01: qty 20

## 2024-02-01 SURGICAL SUPPLY — 15 items
CATH ROBINSON RED A/P 16FR (CATHETERS) IMPLANT
CNTNR URN SCR LID CUP LEK RST (MISCELLANEOUS) ×1 IMPLANT
DEVICE MYOSURE LITE (MISCELLANEOUS) IMPLANT
DEVICE MYOSURE REACH (MISCELLANEOUS) IMPLANT
DILATOR CANAL MILEX (MISCELLANEOUS) IMPLANT
GLOVE BIO SURGEON STRL SZ 6.5 (GLOVE) ×1 IMPLANT
GLOVE BIOGEL PI IND STRL 7.0 (GLOVE) ×2 IMPLANT
GOWN STRL REUS W/ TWL LRG LVL3 (GOWN DISPOSABLE) ×2 IMPLANT
KIT PROCEDURE FLUENT (KITS) ×1 IMPLANT
KIT TURNOVER KIT B (KITS) ×1 IMPLANT
PACK VAGINAL MINOR WOMEN LF (CUSTOM PROCEDURE TRAY) ×1 IMPLANT
PAD OB MATERNITY 4.3X12.25 (PERSONAL CARE ITEMS) ×1 IMPLANT
SEAL ROD LENS SCOPE MYOSURE (ABLATOR) ×1 IMPLANT
TOWEL GREEN STERILE FF (TOWEL DISPOSABLE) ×1 IMPLANT
UNDERPAD 30X36 HEAVY ABSORB (UNDERPADS AND DIAPERS) ×1 IMPLANT

## 2024-02-01 NOTE — Transfer of Care (Signed)
Immediate Anesthesia Transfer of Care Note  Patient: Ashlee Booth  Procedure(s) Performed: DILATATION & CURETTAGE/HYSTEROSCOPY WITH MYOSURE (Uterus)  Patient Location: PACU  Anesthesia Type:General  Level of Consciousness: awake, alert , oriented, and patient cooperative  Airway & Oxygen Therapy: Patient Spontanous Breathing  Post-op Assessment: Report given to RN and Post -op Vital signs reviewed and stable  Post vital signs: Reviewed and stable  Last Vitals:  Vitals Value Taken Time  BP 120/77 02/01/24 1345  Temp 36.7 C 02/01/24 1345  Pulse 74 02/01/24 1345  Resp 14 02/01/24 1345  SpO2 99 % 02/01/24 1345  Vitals shown include unfiled device data.  Last Pain:  Vitals:   02/01/24 1129  TempSrc:   PainSc: 0-No pain         Complications: No notable events documented.

## 2024-02-01 NOTE — Anesthesia Postprocedure Evaluation (Signed)
Anesthesia Post Note  Patient: Ashlee Booth  Procedure(s) Performed: DILATATION & CURETTAGE/HYSTEROSCOPY WITH MYOSURE (Uterus)     Patient location during evaluation: PACU Anesthesia Type: General Level of consciousness: awake and alert, patient cooperative and oriented Pain management: pain level controlled Vital Signs Assessment: post-procedure vital signs reviewed and stable Respiratory status: spontaneous breathing, nonlabored ventilation and respiratory function stable Cardiovascular status: blood pressure returned to baseline and stable Postop Assessment: no apparent nausea or vomiting, adequate PO intake and able to ambulate Anesthetic complications: no   No notable events documented.  Last Vitals:  Vitals:   02/01/24 1400 02/01/24 1415  BP: 117/72 109/72  Pulse: 66 63  Resp: 14 16  Temp:    SpO2: 99% 97%    Last Pain:  Vitals:   02/01/24 1129  TempSrc:   PainSc: 0-No pain                 Liona Wengert,E. Caydance Kuehnle

## 2024-02-01 NOTE — Anesthesia Procedure Notes (Signed)
Procedure Name: LMA Insertion Date/Time: 02/01/2024 1:10 PM  Performed by: Bishop Limbo, CRNAPre-anesthesia Checklist: Patient identified, Emergency Drugs available, Suction available and Patient being monitored Patient Re-evaluated:Patient Re-evaluated prior to induction Oxygen Delivery Method: Circle System Utilized Preoxygenation: Pre-oxygenation with 100% oxygen Induction Type: IV induction Ventilation: Mask ventilation without difficulty LMA: LMA inserted LMA Size: 4.0 Number of attempts: 1 Placement Confirmation: positive ETCO2 Tube secured with: Tape Dental Injury: Teeth and Oropharynx as per pre-operative assessment

## 2024-02-01 NOTE — Op Note (Signed)
Pre Op Dx:   1. Abnormal uterine bleeding 2. Thickened endometrium on ultrasound (1.13cm) 3.  Endometrial mass  Post Op Dx:   1. Abnormal uterine bleeding 2. Thickened endometrium on ultrasound (1.13cm) 3.  Endometrial polyps  Procedure:   Hysteroscopy with Dilation and Curettage with Myosure polypectomy   Surgeon:  Dr. [name] Assistants:  None Anesthesia:  LMA   EBL:  5cc  IVF:  500cc UOP:  Voided prior to arrival to OR Fluid Deficit:  30cc   Drains:  None Specimen removed:  Endometrial polyps and curettings - sent to pathology Device(s) implanted: None Case Type:  Clean-contaminated Findings:  Normal-appearing cervix and endocervical canal. Endometrium appeared normal and two polyps at the fundus was noted. Bilateral tubal ostia visualized. Complications: None Indications:  43 y.o. G5P3 with AUB, thickened endometrium, and endometrial mass on ultrasound.  Description of each procedure:  After informed consent was obtained the patient was taken to the operating room in the dorsal supine position.  After administration of general anesthesia, the patient was placed in the dorsal lithotomy position and prepped and draped in the usual sterile fashion.  Her bladder was emptied using an in and out catheter.  A pre-operative time-out was completed.  The anterior lip of the cervix was grasped with a single-tooth tenaculum and the cervix was serially dilated to accommodate the hysteroscope.  The hysteroscope was advanced and the findings as above was noted. The Myosure Reach was used to resect the endometrial polyps and sample the endometrium in its entirety. A sharp, banjo curette was used to curettage the endometrium. The single-tooth tenaculum was removed and its sites were made hemostatic with silver nitrate and pressure.  Adequate hemostasis was noted.  The patient was awakened and extubated and appeared to have tolerated the procedure well.  All counts were  correct.  Disposition:  PACU  [name], DO

## 2024-02-01 NOTE — Interval H&P Note (Signed)
History and Physical Interval Note:  02/01/2024 12:29 PM  Ashlee Booth  has presented today for surgery, with the diagnosis of Thickened endometrium Endometrial mass.  The various methods of treatment have been discussed with the patient and family. After consideration of risks, benefits and other options for treatment, the patient has consented to  Procedure(s) with comments: DILATATION & CURETTAGE/HYSTEROSCOPY WITH MYOSURE (N/A) - rep to cover case.  Confirmed on 2/17 CS as a surgical intervention.  The patient's history has been reviewed, patient examined, no change in status, stable for surgery.  I have reviewed the patient's chart and labs.  Questions were answered to the patient's satisfaction.     [name]

## 2024-02-01 NOTE — Anesthesia Preprocedure Evaluation (Addendum)
Anesthesia Evaluation  Patient identified by MRN, date of birth, ID band Patient awake    Reviewed: Allergy & Precautions, NPO status , Patient's Chart, lab work & pertinent test results  History of Anesthesia Complications Negative for: history of anesthetic complications  Airway Mallampati: I  TM Distance: >3 FB Neck ROM: Full    Dental  (+) Dental Advisory Given   Pulmonary neg pulmonary ROS   Pulmonary exam normal breath sounds clear to auscultation       Cardiovascular negative cardio ROS  Rhythm:Regular Rate:Normal     Neuro/Psych negative neurological ROS     GI/Hepatic negative GI ROS, Neg liver ROS,,,  Endo/Other  negative endocrine ROS    Renal/GU negative Renal ROS     Musculoskeletal   Abdominal   Peds  Hematology negative hematology ROS (+)     Anesthesia Other Findings   Reproductive/Obstetrics                              Anesthesia Physical Anesthesia Plan  ASA: 2  Anesthesia Plan: General   Post-op Pain Management: Tylenol PO (pre-op)*   Induction: Intravenous  PONV Risk Score and Plan: 3 and Ondansetron, Dexamethasone and Treatment may vary due to age or medical condition  Airway Management Planned: LMA  Additional Equipment:   Intra-op Plan:   Post-operative Plan: Extubation in OR  Informed Consent: I have reviewed the patients History and Physical, chart, labs and discussed the procedure including the risks, benefits and alternatives for the proposed anesthesia with the patient or authorized representative who has indicated his/her understanding and acceptance.     Dental advisory given  Plan Discussed with: CRNA and Anesthesiologist  Anesthesia Plan Comments: (Risks of general anesthesia discussed including, but not limited to, sore throat, hoarse voice, chipped/damaged teeth, injury to vocal cords, nausea and vomiting, allergic reactions, lung  infection, heart attack, stroke, and death. All questions answered. )         Anesthesia Quick Evaluation

## 2024-02-02 ENCOUNTER — Encounter (HOSPITAL_COMMUNITY): Payer: Self-pay | Admitting: Obstetrics and Gynecology

## 2024-02-03 LAB — SURGICAL PATHOLOGY

## 2024-04-13 ENCOUNTER — Other Ambulatory Visit (INDEPENDENT_AMBULATORY_CARE_PROVIDER_SITE_OTHER): Payer: Self-pay

## 2024-04-13 ENCOUNTER — Ambulatory Visit (INDEPENDENT_AMBULATORY_CARE_PROVIDER_SITE_OTHER): Admitting: Physician Assistant

## 2024-04-13 ENCOUNTER — Encounter: Payer: Self-pay | Admitting: Physician Assistant

## 2024-04-13 DIAGNOSIS — S93401A Sprain of unspecified ligament of right ankle, initial encounter: Secondary | ICD-10-CM | POA: Diagnosis not present

## 2024-04-13 DIAGNOSIS — M25571 Pain in right ankle and joints of right foot: Secondary | ICD-10-CM

## 2024-04-13 NOTE — Progress Notes (Signed)
 Office Visit Note   Patient: Ashlee Booth           Date of Birth: 1981/04/25           MRN: [id number] Visit Date: 04/13/2024              Requested by: Irby Mannan, NP [id number] [place],  [place] [address] PCP: Irby Mannan, NP   Assessment & Plan: Visit Diagnoses:  1. Pain in right ankle and joints of right foot   2. Sprain of unspecified ligament of right ankle, initial encounter     Plan: Pleasant active 43 year old woman with a 43-month history of right lateral ankle pain after she fell down some steps.  She was seen and evaluated by her primary care who was told that things should get better.  She is concerned because she continues to have pain isolated to the lateral aspect of her ankle.  Exam today consistent with ankle sprain.  Discussed with her I think at this point would be good to have her use a brace as needed and get her in with physical therapy.  Would like to have her follow-up in 6 weeks if she still has significant symptoms would consider an MRI  Follow-Up Instructions: Return in about 6 weeks (around 05/25/2024).   Orders:  Orders Placed This Encounter  Procedures   XR Ankle Complete Right   Ambulatory referral to Physical Therapy   No orders of the defined types were placed in this encounter.     Procedures: No procedures performed   Clinical Data: No additional findings.   Subjective: Chief Complaint  Patient presents with   Right Ankle - Pain    HPI patient is a pleasant 43 year old woman who comes in today with a chief complaint of right ankle pain.  She says she fell down some steps a couple months ago.  She started having right ankle pain.  She was seen by her primary care who told her that everything was fine.  No x-rays were taken.  Originally she had swelling but hurts to apply pressure or even hurts at rest pain is in the front and on the outside of the ankle.  She was taken using ice heat and nonsteroidals when  it first happened  Review of Systems  All other systems reviewed and are negative.    Objective: Vital Signs: There were no vitals taken for this visit.  Physical Exam Constitutional:      Appearance: Normal appearance.  Pulmonary:     Effort: Pulmonary effort is normal.  Skin:    General: Skin is warm and dry.  Neurological:     General: No focal deficit present.     Mental Status: She is alert and oriented to person, place, and time.     Ortho Exam Lamination of her right ankle she is neurovascular intact she has strong pulses she has good dorsiflexion plantarflexion eversion inversion slight increase on anterior draw she is focally isolated tenderness is over the lateral ligaments Specialty Comments:  EXAM: MRI LUMBAR SPINE WITHOUT CONTRAST   TECHNIQUE: Multiplanar, multisequence MR imaging of the lumbar spine was performed. No intravenous contrast was administered.   COMPARISON:  None Available.   FINDINGS: Segmentation: 5 lumbar type vertebrae based on the available coverage   Alignment:  Normal   Vertebrae:  No fracture, evidence of discitis, or bone lesion.   Conus medullaris and cauda equina: Conus extends to the L2 level. Conus and cauda  equina appear normal.   Paraspinal and other soft tissues: Right hepatic cystic intensity, small and non worrisome.   Disc levels:   Diffusely preserved disc height and hydration. T2 hyperintensity posterior and lateral to the right facet which appears distinct from it and is likely small lymphangioma or varix. No noted facet degeneration. No neural impingement.   IMPRESSION: Negative for neural impingement or visible inflammation.     Electronically Signed   By: Ronnette Coke M.D.   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 No pertinent family history.  Past Surgical History:  Procedure Laterality Date   DILATATION & CURETTAGE/HYSTEROSCOPY WITH MYOSURE N/A 02/01/2024   Procedure: DILATATION & CURETTAGE/HYSTEROSCOPY WITH MYOSURE;  Surgeon: Meldon Sport, DO;  Location: [facility] OR;  Service: Gynecology;  Laterality: N/A;  rep to cover case.  Confirmed on 2/17 CS   TUBAL LIGATION     Social History   Occupational History   Not on file  Tobacco Use   Smoking status: Never   Smokeless tobacco: Never  Vaping Use   Vaping status: Never Used  Substance and Sexual Activity   Alcohol use: Not Currently   Drug use: No   Sexual activity: Not on file

## 2024-04-27 ENCOUNTER — Other Ambulatory Visit: Payer: Self-pay

## 2024-04-27 ENCOUNTER — Ambulatory Visit: Attending: Physician Assistant

## 2024-04-27 DIAGNOSIS — M25571 Pain in right ankle and joints of right foot: Secondary | ICD-10-CM | POA: Insufficient documentation

## 2024-04-27 DIAGNOSIS — S93401A Sprain of unspecified ligament of right ankle, initial encounter: Secondary | ICD-10-CM | POA: Insufficient documentation

## 2024-04-27 DIAGNOSIS — R2689 Other abnormalities of gait and mobility: Secondary | ICD-10-CM | POA: Diagnosis present

## 2024-04-27 DIAGNOSIS — M6281 Muscle weakness (generalized): Secondary | ICD-10-CM | POA: Insufficient documentation

## 2024-04-27 DIAGNOSIS — R600 Localized edema: Secondary | ICD-10-CM | POA: Insufficient documentation

## 2024-04-27 NOTE — Therapy (Signed)
 OUTPATIENT PHYSICAL THERAPY LOWER EXTREMITY EVALUATION   Patient Name: Ashlee Booth MRN: [id number] DOB:1981/11/15, 43 y.o., female Today's Date: 04/27/2024  END OF SESSION:  PT End of Session - 04/27/24 1150     Visit Number 1    Number of Visits 17    Date for PT Re-Evaluation 06/22/24    Authorization Type BCBS    PT Start Time 1017    PT Stop Time 1055    PT Time Calculation (min) 38 min    Activity Tolerance Patient tolerated treatment well    Behavior During Therapy WFL for tasks assessed/performed             Past Medical History:  Diagnosis Date   Herpes genitalia    Past Surgical History:  Procedure Laterality Date   DILATATION & CURETTAGE/HYSTEROSCOPY WITH MYOSURE N/A 02/01/2024   Procedure: DILATATION & CURETTAGE/HYSTEROSCOPY WITH MYOSURE;  Surgeon: Ashlee Sport, DO;  Location: [facility] OR;  Service: Gynecology;  Laterality: N/A;  rep to cover case.  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 Denies N/T, pain is mainly anterior and lateral ankle. Also starting to note R knee pain. Swelling and pain more at end of the day, works for [employer] and is on her feet and lifting. Given ASO brace  at [facility] but denies that it is seeming to help.   PERTINENT HISTORY: R ankle inversion sprain  PAIN:  Are you having pain?  Yes: NPRS scale: 5/10 Worst: 8/10 Pain location: R lateral/anterior  Pain description: sharp, sore, tight Aggravating factors: standing, walking, stairs Relieving factors: rest, ice  PRECAUTIONS: None  RED FLAGS: None   WEIGHT BEARING RESTRICTIONS: No  FALLS:  Has patient fallen in last 6 months? Yes. 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  DIAGNOSTIC FINDINGS: See imaging  PATIENT SURVEYS:  LEFS: 64/80   COGNITION: Overall cognitive status: Within functional limits for tasks assessed    SENSATION: WFL  EDEMA:  DNT  POSTURE: No Significant postural limitations  PALPATION: TTP to R calf and fibularis longus/brevis  LOWER EXTREMITY ROM:  Active ROM Right eval Left eval  Hip flexion    Hip extension    Hip abduction    Hip adduction    Hip internal rotation    Hip external rotation    Knee flexion    Knee extension 2 12  Ankle dorsiflexion    Ankle plantarflexion  Ankle inversion    Ankle eversion     (Blank rows = not tested)  LOWER EXTREMITY MMT:  MMT Right eval Left eval  Hip flexion    Hip extension    Hip abduction    Hip adduction    Hip internal rotation    Hip external rotation    Knee flexion    Knee extension    Ankle dorsiflexion 3+ 5  Ankle plantarflexion 4 5  Ankle inversion 2+ 5  Ankle eversion 2+ 5   (Blank rows = not tested)  LOWER EXTREMITY SPECIAL TESTS:  DNT  FUNCTIONAL TESTS:  SLS: R - 30 sec; L - 15 seconds  GAIT: Distance walked: 71ft Assistive device utilized:  None Level of assistance: Complete Independence Comments: slight antalgic gait R   TREATMENT: [facility] Adult PT Treatment:                                                DATE: 04/27/24 Therapeutic Exercise: Ankle DF/ev YTB x 10 each R calf stretch x 30" with towel Tandem x 30" R back  PATIENT EDUCATION:  Education details: eval findings, FOTO, HEP, POC Person educated: Patient Education method: Explanation, Demonstration, and Handouts Education comprehension: verbalized understanding and returned demonstration  HOME EXERCISE PROGRAM: Access Code: [email or web address] URL: [email or web address] Date: 04/27/2024 Prepared by: Ashlee Booth  Exercises - Ankle Dorsiflexion with Resistance  - 1 x daily - 7 x weekly - 2 reps - yellow band hold - Ankle Eversion with Resistance  - 1 x daily - 7 x weekly - 2 reps - yellow band hold - Long Sitting Calf Stretch with Strap  - 1 x daily - 7 x weekly - 2 reps - 30 sec hold - Standing Tandem Balance with Counter Support  - 1 x daily - 7 x weekly - 2 reps - 30 sec hold  ASSESSMENT:  CLINICAL IMPRESSION: Patient is a 43 y.o. F who was seen today for physical therapy evaluation and treatment for acute on chronic R ankle pain post inversion sprain. Physical findings are consistent with referring provider impression as pt demonstrates decrease in R ankle strength, ROM, and stability. LEFS score shows minimal disability in performance of home ADLs and community activities. Pt would benefit from skilled PT services post ankle sprain to get back to PLOF.   OBJECTIVE IMPAIRMENTS: Abnormal gait, decreased activity tolerance, decreased mobility, difficulty walking, decreased ROM, decreased strength, and pain.   ACTIVITY LIMITATIONS: standing, squatting, stairs, transfers, and locomotion level  PARTICIPATION LIMITATIONS: driving, shopping, community activity, occupation, and yard work  PERSONAL FACTORS: Time since onset of injury/illness/exacerbation are  also affecting patient's functional outcome.   REHAB POTENTIAL: Excellent  CLINICAL DECISION MAKING: Stable/uncomplicated  EVALUATION COMPLEXITY: Low   GOALS: Goals reviewed with patient? No  SHORT TERM GOALS: Target date: 05/18/2024   Pt will be compliant and knowledgeable with initial HEP for improved comfort and carryover Baseline: initial HEP given  Goal status: INITIAL  2.  Pt will self report right ankle pain no greater than 5/10 for improved comfort and functional ability Baseline: 8/10 at worst Goal status: INITIAL   LONG TERM GOALS: Target date: 06/22/2024   Pt will improve LEFS to no less than 75/80 as proxy for functional improvement with home ADLs and higher level community activity Baseline: 64/80 Goal status: INITIAL   2.  Pt will  self report right ankle pain no greater than 1-2/10 for improved comfort and functional ability Baseline: 8/10 at worst Goal status: INITIAL   3.  Pt will improve R ankle MMT to no less than 4/5 for all tested motions for improved stability and decrease pain Baseline: see MMT chart Goal status: INITIAL  4.  Pt will improve R SLS time to at least 30 seconds for improved balance and ankle stability Baseline: 15 seconds Goal status: INITIAL  5.  Pt will improve R ankle DF AROM to at least 10 degrees for improved mobility and squatting Baseline: 2 degrees Goal status: INITIAL   PLAN:  PT FREQUENCY: 1-2x/week  PT DURATION: 8 weeks  PLANNED INTERVENTIONS: 97164- PT Re-evaluation, 97110-Therapeutic exercises, 97530- Therapeutic activity, [id number]- Neuromuscular re-education, 97535- Self Care, 82956- Manual therapy, [id number]- Gait training, O1308- Electrical stimulation (unattended), [id number]- Electrical stimulation (manual), 97016- Vasopneumatic device, Dry Needling, Cryotherapy, and Moist heat  PLAN FOR NEXT SESSION: assess HEP response, ankle strengthening, balance/proprioception   For all possible CPT codes, reference the Planned  Interventions line above.     Check all conditions that are expected to impact treatment: {Conditions expected to impact treatment:Musculoskeletal disorders   If treatment provided at initial evaluation, no treatment charged due to lack of authorization.       Ivor Mars, PT 04/27/2024, 11:51 AM

## 2024-05-04 ENCOUNTER — Ambulatory Visit (INDEPENDENT_AMBULATORY_CARE_PROVIDER_SITE_OTHER): Admitting: Family

## 2024-05-04 ENCOUNTER — Ambulatory Visit: Admitting: Family

## 2024-05-04 ENCOUNTER — Other Ambulatory Visit (INDEPENDENT_AMBULATORY_CARE_PROVIDER_SITE_OTHER): Payer: Self-pay

## 2024-05-04 DIAGNOSIS — M5416 Radiculopathy, lumbar region: Secondary | ICD-10-CM

## 2024-05-04 MED ORDER — PREDNISONE 50 MG PO TABS: ORAL_TABLET | ORAL | 0 refills | Status: AC

## 2024-05-04 NOTE — Progress Notes (Unsigned)
 Office Visit Note   Patient: Ashlee Booth           Date of Birth: May 20, 1981           MRN: [id number] Visit Date: 05/04/2024              Requested by: Irby Mannan, NP [address] [place],  [place] [address] PCP: Irby Mannan, NP  Chief Complaint  Patient presents with   Left Foot - Pain      HPI: \The patient is a 43 year old woman who presents complaining of left lower extremity pain and heaviness.  She has had some soreness in the anterior thigh which is nagging aching constant she feels like she is dragging her leg and foot.  She does have to stand for prolonged work and has had difficulty with prolonged standing.  She does also have some low back pain which radiates into her groin complains of numbness tingling, stiffness in her toes  No red flag symptoms  Assessment & Plan: Visit Diagnoses: No diagnosis found.  Plan: Will place on a course of oral prednisone  for 5 days.  Patient agreed with the plan.    Follow-Up Instructions: No follow-ups on file.   Back Exam   Tenderness  The patient is experiencing no tenderness.   Range of Motion  The patient has normal back ROM. Extension:  normal  Flexion:  normal   Muscle Strength  The patient has normal back strength.  Tests  Straight leg raise left: positive  Other  Gait: normal       Patient is alert, oriented, no adenopathy, well-dressed, normal affect, normal respiratory effort.   Imaging: No results found. No images are attached to the encounter.  Labs: Lab Results  Component Value Date   REPTSTATUS 03/21/2019 FINAL 03/20/2019   CULT  03/20/2019    NO GROWTH Performed at [facility], [address] [place], [place] [address]      Lab Results  Component Value Date   ALBUMIN 4.1 01/03/2023   ALBUMIN 4.3 03/29/2021   ALBUMIN 4.2 03/12/2021    No results found for: "MG" No results found for: "VD25OH"  No results found for: "PREALBUMIN"    Latest Ref  Rng & Units 02/01/2024   10:29 AM 01/03/2023    5:47 PM 04/08/2021    8:18 AM  CBC EXTENDED  WBC 4.0 - 10.5 K/uL 4.9  6.3  3.7   RBC 3.87 - 5.11 MIL/uL 4.67  4.75  4.68   Hemoglobin 12.0 - 15.0 g/dL 82.9  56.2  13.0   HCT 36.0 - 46.0 % 41.5  42.7  42.4   Platelets 150 - 400 K/uL 241  235  246   NEUT# 1.7 - 7.7 K/uL   1.7   Lymph# 0.7 - 4.0 K/uL   1.3      There is no height or weight on file to calculate BMI.  Orders:  No orders of the defined types were placed in this encounter.  No orders of the defined types were placed in this encounter.    Procedures: No procedures performed  Clinical Data: No additional findings.  ROS:  All other systems negative, except as noted in the HPI. Review of Systems  Objective: Vital Signs: There were no vitals taken for this visit.  Specialty Comments:  EXAM: MRI LUMBAR SPINE WITHOUT CONTRAST   TECHNIQUE: Multiplanar, multisequence MR imaging of the lumbar spine was performed. No intravenous contrast was administered.  COMPARISON:  None Available.   FINDINGS: Segmentation: 5 lumbar type vertebrae based on the available coverage   Alignment:  Normal   Vertebrae:  No fracture, evidence of discitis, or bone lesion.   Conus medullaris and cauda equina: Conus extends to the L2 level. Conus and cauda equina appear normal.   Paraspinal and other soft tissues: Right hepatic cystic intensity, small and non worrisome.   Disc levels:   Diffusely preserved disc height and hydration. T2 hyperintensity posterior and lateral to the right facet which appears distinct from it and is likely small lymphangioma or varix. No noted facet degeneration. No neural impingement.   IMPRESSION: Negative for neural impingement or visible inflammation.     Electronically Signed   By: Ronnette Coke M.D.   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  Past Surgical History:  Procedure Laterality Date   DILATATION & CURETTAGE/HYSTEROSCOPY WITH MYOSURE N/A 02/01/2024   Procedure: DILATATION & CURETTAGE/HYSTEROSCOPY WITH MYOSURE;  Surgeon: Meldon Sport, DO;  Location: [facility] OR;  Service: Gynecology;  Laterality: N/A;  rep to cover case.  Confirmed on 2/17 CS   TUBAL LIGATION     Social History   Occupational History   Not on file  Tobacco Use   Smoking status: Never   Smokeless tobacco: Never  Vaping Use   Vaping status: Never Used  Substance and Sexual Activity   Alcohol use: Not Currently   Drug use: No   Sexual activity: Not on file

## 2024-05-07 ENCOUNTER — Encounter: Payer: Self-pay | Admitting: Family

## 2024-05-11 ENCOUNTER — Encounter: Payer: Self-pay | Admitting: Physical Therapy

## 2024-05-11 ENCOUNTER — Ambulatory Visit: Admitting: Physical Therapy

## 2024-05-11 DIAGNOSIS — M25571 Pain in right ankle and joints of right foot: Secondary | ICD-10-CM

## 2024-05-11 DIAGNOSIS — M6281 Muscle weakness (generalized): Secondary | ICD-10-CM

## 2024-05-11 NOTE — Therapy (Addendum)
 OUTPATIENT PHYSICAL THERAPY TREATMENT NOTE/DISCHARGE  PHYSICAL THERAPY DISCHARGE SUMMARY  Visits from Start of Care: 2  Current functional level related to goals / functional outcomes: See goals/objective   Remaining deficits: Unable to assess   Education / Equipment: HEP   Patient agrees to discharge. Patient goals were unable to assess. Patient is being discharged due to not returning since the last visit.     Patient Name: Ashlee Booth MRN: [id number] DOB:09/03/1981, 43 y.o., female Today's Date: 05/11/2024  END OF SESSION:  PT End of Session - 05/11/24 1321     Visit Number 2    Number of Visits 17    Date for PT Re-Evaluation 06/22/24    Authorization Type BCBS    PT Start Time 1317    PT Stop Time 1355    PT Time Calculation (min) 38 min             Past Medical History:  Diagnosis Date   Herpes genitalia    Past Surgical History:  Procedure Laterality Date   DILATATION & CURETTAGE/HYSTEROSCOPY WITH MYOSURE N/A 02/01/2024   Procedure: DILATATION & CURETTAGE/HYSTEROSCOPY WITH MYOSURE;  Surgeon: Storm Setter, DO;  Location: [facility] OR;  Service: Gynecology;  Laterality: N/A;  rep to cover case.  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 No change so far.    Pt  presents to PT with roughly 2-3 month hx of R ankle pain s/p R inversion sprain. Denies N/T, pain is mainly anterior and lateral ankle. Also starting to note R knee pain. Swelling and pain more at end of the day, works for [employer] and is on her feet and lifting. Given ASO brace at [facility] but denies that it is seeming to help.   PERTINENT HISTORY: R ankle inversion sprain  PAIN:  Are you having pain?  Yes: NPRS scale: 5/10 Worst: 8/10 Pain location: R lateral/anterior  Pain description: sharp, sore, tight Aggravating factors: standing, walking, stairs Relieving factors: rest, ice  PRECAUTIONS: None  RED FLAGS: None   WEIGHT BEARING RESTRICTIONS: No  FALLS:  Has patient fallen in last 6 months? Yes. 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  DIAGNOSTIC FINDINGS: See imaging  PATIENT SURVEYS:  LEFS: 64/80   COGNITION: Overall cognitive status: Within functional limits for tasks assessed    SENSATION: WFL  EDEMA:  DNT  POSTURE: No Significant postural limitations  PALPATION: TTP to R calf and fibularis longus/brevis  LOWER EXTREMITY ROM:  Active ROM Right eval Left eval Right  05/11/24  Left 05/11/24  Hip flexion      Hip extension      Hip abduction      Hip adduction      Hip internal rotation      Hip external rotation      Knee flexion      Knee extension 2 12    Ankle dorsiflexion   5 10  Ankle plantarflexion      Ankle inversion      Ankle eversion       (Blank rows = not tested)  LOWER EXTREMITY MMT:  MMT Right eval Left eval  Hip flexion    Hip extension    Hip abduction    Hip adduction    Hip internal rotation    Hip external  rotation    Knee flexion    Knee extension    Ankle dorsiflexion 3+ 5  Ankle plantarflexion 4 5  Ankle inversion 2+ 5  Ankle eversion 2+ 5   (Blank rows = not tested)  LOWER EXTREMITY SPECIAL TESTS:  DNT  FUNCTIONAL TESTS:  SLS: R - 30 sec; L - 15 seconds 05/11/24: SLS Right >60 sec , Left 30 sec   GAIT: Distance walked: 17ft Assistive device utilized: None Level of assistance: Complete Independence Comments: slight antalgic gait R   TREATMENT: [facility] Adult PT Treatment:                                                DATE: 05/11/24 Therapeutic Exercise: Toe scrunches  Toe Yoga Seated heel and toe raises  Seated YTB 10 x 2 INV, EV DF  Seated Calf stretch  Bilat heel raise x 10 Tandem RLE back >60 sec  Right SLS >60 sec  Tandem on AIREX  Updated HEP     [facility] Adult PT Treatment:                                                DATE: 04/27/24 Therapeutic Exercise: Ankle DF/ev YTB x 10 each R calf stretch x 30 with towel Tandem x 30 R back  PATIENT EDUCATION:  Education details: eval findings, FOTO, HEP, POC Person educated: Patient Education method: Explanation, Demonstration, and Handouts Education comprehension: verbalized understanding and returned demonstration  HOME EXERCISE PROGRAM: Access Code: [email or web address] URL: [email or web address] Date: 04/27/2024 Prepared by: Alm Kingdom  Exercises - Ankle Dorsiflexion with Resistance  - 1 x daily - 7 x weekly - 2 reps - yellow band hold - Ankle Eversion with Resistance  - 1 x daily - 7 x weekly - 2 reps - yellow band hold - Long Sitting Calf Stretch with Strap  - 1 x daily - 7 x weekly - 2 reps - 30 sec hold - Standing Tandem Balance with Counter Support  - 1 x daily - 7 x weekly - 2 reps - 30 sec hold  ASSESSMENT:  CLINICAL IMPRESSION: Pt reports compliance with HEP and continued ankle pain. Reviewed and progressed HEP with closed chain activity. Pt did have some pain with seated resistance ankle bands.  She did well with closed chain heel raises and balance. She did feel fatigued quickly while performing heel raises. Updated HEP.    EVAL: Patient is a 43 y.o.  F who was seen today for physical therapy evaluation and treatment for acute on chronic R ankle pain post inversion sprain. Physical findings are consistent with referring provider impression as pt demonstrates decrease in R ankle strength, ROM, and stability. LEFS score shows minimal disability in performance of home ADLs and community activities. Pt would benefit from skilled PT services post ankle sprain to get back to PLOF.   OBJECTIVE IMPAIRMENTS: Abnormal gait, decreased activity tolerance, decreased mobility, difficulty walking, decreased ROM, decreased strength, and pain.   ACTIVITY LIMITATIONS: standing, squatting, stairs, transfers, and locomotion level  PARTICIPATION LIMITATIONS: driving, shopping, community activity, occupation, and yard work  PERSONAL FACTORS: Time since onset of injury/illness/exacerbation are also affecting patient's functional outcome.   REHAB POTENTIAL: Excellent  CLINICAL DECISION MAKING: Stable/uncomplicated  EVALUATION COMPLEXITY: Low   GOALS: Goals reviewed with patient? No  SHORT TERM GOALS: Target date: 05/18/2024   Pt will be compliant and knowledgeable with initial HEP for improved comfort and carryover Baseline: initial HEP given  Goal status: INITIAL  2.  Pt will self report right ankle pain no greater than 5/10 for improved comfort and functional ability Baseline: 8/10 at worst Goal status: INITIAL   LONG TERM GOALS: Target date: 06/22/2024   Pt will improve LEFS to no less than 75/80 as proxy for functional improvement with home ADLs and higher level community activity Baseline: 64/80 Goal status: INITIAL   2.  Pt will self report right ankle pain no greater than 1-2/10 for improved comfort and functional ability Baseline: 8/10 at worst Goal status: INITIAL   3.  Pt will  improve R ankle MMT to no less than 4/5 for all tested motions for improved stability and decrease pain Baseline: see MMT chart Goal status: INITIAL  4.  Pt will improve R SLS time to at least 30 seconds for improved balance and ankle stability Baseline: 15 seconds Goal status: INITIAL  5.  Pt will improve R ankle DF AROM to at least 10 degrees for improved mobility and squatting Baseline: 2 degrees Goal status: INITIAL   PLAN:  PT FREQUENCY: 1-2x/week  PT DURATION: 8 weeks  PLANNED INTERVENTIONS: 97164- PT Re-evaluation, 97110-Therapeutic exercises, 97530- Therapeutic activity, [id number]- Neuromuscular re-education, 97535- Self Care, [id number]- Manual therapy, [id number]- Gait training, H9716- Electrical stimulation (unattended), [id number]- Electrical stimulation (manual), 97016- Vasopneumatic device, Dry Needling, Cryotherapy, and Moist heat  PLAN FOR NEXT SESSION: assess HEP response, ankle strengthening, balance/proprioception   For all possible CPT codes, reference the Planned Interventions line above.     Check all conditions that are expected to impact treatment: {Conditions expected to impact treatment:Musculoskeletal disorders   If treatment provided at initial evaluation, no treatment charged due to lack of authorization.       Harlene Persons, PTA 05/11/24 2:05 PM Phone: [phone] Fax: [phone]

## 2024-05-12 ENCOUNTER — Telehealth: Payer: Self-pay | Admitting: Physical Therapy

## 2024-05-12 ENCOUNTER — Ambulatory Visit: Admitting: Physical Therapy

## 2024-05-12 NOTE — Telephone Encounter (Signed)
Called and informed patient of missed visit and provided reminder of next appt and attendance policy via VM. 

## 2024-05-18 ENCOUNTER — Ambulatory Visit: Attending: Physician Assistant | Admitting: Physical Therapy

## 2024-05-18 ENCOUNTER — Telehealth: Payer: Self-pay | Admitting: Physical Therapy

## 2024-05-18 NOTE — Telephone Encounter (Signed)
Called and informed patient of missed visit and provided reminder of next appt and attendance policy.  

## 2024-05-19 ENCOUNTER — Ambulatory Visit: Admitting: Physical Therapy

## 2024-05-25 ENCOUNTER — Encounter: Admitting: Physical Therapy

## 2024-05-26 ENCOUNTER — Ambulatory Visit: Admitting: Physical Therapy

## 2024-07-20 ENCOUNTER — Ambulatory Visit: Admitting: Family

## 2024-08-03 ENCOUNTER — Ambulatory Visit: Admitting: Family

## 2024-10-15 ENCOUNTER — Encounter: Payer: Self-pay | Admitting: Radiology

## 2024-10-26 ENCOUNTER — Encounter: Payer: Self-pay | Admitting: Family

## 2024-10-26 ENCOUNTER — Ambulatory Visit: Admitting: Family

## 2024-10-26 DIAGNOSIS — M5416 Radiculopathy, lumbar region: Secondary | ICD-10-CM

## 2024-10-26 MED ORDER — PREDNISONE 50 MG PO TABS
ORAL_TABLET | ORAL | 0 refills | Status: AC
Start: 2024-10-26 — End: ?

## 2024-10-26 MED ORDER — HYDROCODONE-ACETAMINOPHEN 5-325 MG PO TABS: 1.0000 | ORAL_TABLET | Freq: Three times a day (TID) | ORAL | 0 refills | Status: DC | PRN

## 2024-10-26 NOTE — Progress Notes (Signed)
 Office Visit Note   Patient: Ashlee Booth           Date of Birth: Feb 20, 1981           MRN: [id number] Visit Date: 10/26/2024              Requested by: Darene Josette POUR, NP [address] [place],  [place] [address] PCP: Darene Josette POUR, NP  No chief complaint on file.     HPI: The patient is a 43 year old woman who presents today complaining of low back pain she is had a history of sciatica feels this is similar to previous episodes she is having shooting electrical pain that radiates down to the foot bilaterally left worse than right.  She does have associated tingling denies any weakness  Has not had any associated injury or fall no red flag symptoms  Did have lumbar radiographs on May 23 of this year.  Assessment & Plan: Visit Diagnoses: No diagnosis found.  Plan: Will place her on a course of prednisone  plan to proceed with lumbar MRI consideration of injection will refer to Dr. Lyda clinic pending MRI.  Follow-Up Instructions: No follow-ups on file.   Back Exam   Tenderness  The patient is experiencing no tenderness.   Range of Motion  The patient has normal back ROM.  Muscle Strength  The patient has normal back strength.  Tests  Straight leg raise right: positive Straight leg raise left: positive  Other  Gait: normal       Patient is alert, oriented, no adenopathy, well-dressed, normal affect, normal respiratory effort.     Imaging: No results found. No images are attached to the encounter.  Labs: Lab Results  Component Value Date   REPTSTATUS 03/21/2019 FINAL 03/20/2019   CULT  03/20/2019    NO GROWTH Performed at [facility], [address] [place], [place] [address]      Lab Results  Component Value Date   ALBUMIN 4.1 01/03/2023   ALBUMIN 4.3 03/29/2021   ALBUMIN 4.2 03/12/2021    No results found for: MG No results found for: VD25OH  No results found for: PREALBUMIN    Latest Ref Rng &  Units 02/01/2024   10:29 AM 01/03/2023    5:47 PM 04/08/2021    8:18 AM  CBC EXTENDED  WBC 4.0 - 10.5 K/uL 4.9  6.3  3.7   RBC 3.87 - 5.11 MIL/uL 4.67  4.75  4.68   Hemoglobin 12.0 - 15.0 g/dL 85.6  85.8  85.6   HCT 36.0 - 46.0 % 41.5  42.7  42.4   Platelets 150 - 400 K/uL 241  235  246   NEUT# 1.7 - 7.7 K/uL   1.7   Lymph# 0.7 - 4.0 K/uL   1.3      There is no height or weight on file to calculate BMI.  Orders:  No orders of the defined types were placed in this encounter.  No orders of the defined types were placed in this encounter.    Procedures: No procedures performed  Clinical Data: No additional findings.  ROS:  All other systems negative, except as noted in the HPI. Review of Systems  Objective: Vital Signs: There were no vitals taken for this visit.  Specialty Comments:  EXAM: MRI LUMBAR SPINE WITHOUT CONTRAST   TECHNIQUE: Multiplanar, multisequence MR imaging of the lumbar spine was performed. No intravenous contrast was administered.   COMPARISON:  None Available.   FINDINGS:  Segmentation: 5 lumbar type vertebrae based on the available coverage   Alignment:  Normal   Vertebrae:  No fracture, evidence of discitis, or bone lesion.   Conus medullaris and cauda equina: Conus extends to the L2 level. Conus and cauda equina appear normal.   Paraspinal and other soft tissues: Right hepatic cystic intensity, small and non worrisome.   Disc levels:   Diffusely preserved disc height and hydration. T2 hyperintensity posterior and lateral to the right facet which appears distinct from it and is likely small lymphangioma or varix. No noted facet degeneration. No neural impingement.   IMPRESSION: Negative for neural impingement or visible inflammation.     Electronically Signed   By: Dorn Roulette M.D.   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  Past Surgical History:  Procedure Laterality Date   DILATATION & CURETTAGE/HYSTEROSCOPY WITH MYOSURE N/A 02/01/2024   Procedure: DILATATION & CURETTAGE/HYSTEROSCOPY WITH MYOSURE;  Surgeon: Storm Setter, DO;  Location: [facility] OR;  Service: Gynecology;  Laterality: N/A;  rep to cover case.  Confirmed on 2/17 CS   TUBAL LIGATION     Social History   Occupational History   Not on file  Tobacco Use   Smoking status: Never   Smokeless tobacco: Never  Vaping Use   Vaping status: Never Used  Substance and Sexual Activity   Alcohol use: Not Currently   Drug use: No   Sexual activity: Not on file

## 2024-11-02 ENCOUNTER — Encounter: Payer: Self-pay | Admitting: Family

## 2024-11-10 ENCOUNTER — Ambulatory Visit
Admission: RE | Admit: 2024-11-10 | Discharge: 2024-11-10 | Disposition: A | Discharge status: Home / Self Care | Source: Ambulatory Visit | Attending: Family | Admitting: Family

## 2024-11-10 DIAGNOSIS — M5416 Radiculopathy, lumbar region: Secondary | ICD-10-CM

## 2024-11-26 ENCOUNTER — Encounter: Admitting: Physical Medicine and Rehabilitation

## 2024-12-10 ENCOUNTER — Other Ambulatory Visit: Payer: Self-pay

## 2024-12-10 ENCOUNTER — Ambulatory Visit: Admitting: Physical Medicine and Rehabilitation

## 2024-12-10 VITALS — BP 115/71 | HR 66

## 2024-12-10 DIAGNOSIS — M5416 Radiculopathy, lumbar region: Secondary | ICD-10-CM | POA: Diagnosis not present

## 2024-12-10 MED ORDER — METHYLPREDNISOLONE ACETATE 40 MG/ML IJ SUSP
40.0000 mg | Freq: Once | INTRAMUSCULAR | Status: AC
  Administered 2024-12-10: 40 mg

## 2024-12-10 NOTE — Progress Notes (Signed)
 sePain Scale   Average Pain 7 Patient advising she has chronic lower back pain radiating bilaterally to legs, walking and standing increase her pain and laying down decreases her pain.        +Driver, -BT, -Dye Allergies.

## 2024-12-10 NOTE — Progress Notes (Signed)
 "  Ashlee Booth - 43 y.o. female MRN [id number]  Date of birth: 03-06-1981  Office Visit Note: Visit Date: 12/10/2024 PCP: Darene Josette POUR, NP Referred by: Darene Josette POUR, NP  Subjective: Chief Complaint  Patient presents with   Lower Back - Pain   HPI:  Ashlee Booth is a 43 y.o. female who comes in today at the request of Rocky Hans, FNP for planned Right L5-S1 Lumbar Interlaminar epidural steroid injection with fluoroscopic guidance.  The patient has failed conservative care including home exercise, medications, time and activity modification.  This injection will be diagnostic and hopefully therapeutic.  Please see requesting physician notes for further details and justification.   ROS Otherwise per HPI.  Assessment & Plan: Visit Diagnoses:    ICD-10-CM   1. Lumbar radiculopathy  M54.16 XR C-ARM NO REPORT    Epidural Steroid injection    methylPREDNISolone  acetate (DEPO-MEDROL ) injection 40 mg      Plan: No additional findings.   Meds & Orders:  Meds ordered this encounter  Medications   methylPREDNISolone  acetate (DEPO-MEDROL ) injection 40 mg    Orders Placed This Encounter  Procedures   XR C-ARM NO REPORT   Epidural Steroid injection    Follow-up: Return for visit to requesting provider as needed.   Procedures: No procedures performed  Lumbar Epidural Steroid Injection - Interlaminar Approach with Fluoroscopic Guidance  Patient: Ashlee Booth      Date of Birth: Jun 09, 1981 MRN: [id number] PCP: Darene Josette POUR, NP      Visit Date: 12/10/2024   Universal Protocol:     Consent Given By: the patient  Position: PRONE  Additional Comments: Vital signs were monitored before and after the procedure. Patient was prepped and draped in the usual sterile fashion. The correct patient, procedure, and site was verified.   Injection Procedure Details:   Procedure diagnoses: Lumbar radiculopathy [M54.16]   Meds Administered:  Meds ordered this  encounter  Medications   methylPREDNISolone  acetate (DEPO-MEDROL ) injection 40 mg     Laterality: Right  Location/Site:  L5-S1  Needle: 3.5 in., 20 ga. Tuohy  Needle Placement: Paramedian epidural  Findings:   -Comments: Excellent flow of contrast into the epidural space.  Procedure Details: Using a paramedian approach from the side mentioned above, the region overlying the inferior lamina was localized under fluoroscopic visualization and the soft tissues overlying this structure were infiltrated with 4 ml. of 1% Lidocaine  without Epinephrine. The Tuohy needle was inserted into the epidural space using a paramedian approach.   The epidural space was localized using loss of resistance along with counter oblique bi-planar fluoroscopic views.  After negative aspirate for air, blood, and CSF, a 2 ml. volume of Isovue-250 was injected into the epidural space and the flow of contrast was observed. Radiographs were obtained for documentation purposes.    The injectate was administered into the level noted above.   Additional Comments:  The patient tolerated the procedure well Dressing: 2 x 2 sterile gauze and Band-Aid    Post-procedure details: Patient was observed during the procedure. Post-procedure instructions were reviewed.  Patient left the clinic in stable condition.   Clinical History: EXAM: MRI LUMBAR SPINE WITHOUT CONTRAST   TECHNIQUE: Multiplanar, multisequence MR imaging of the lumbar spine was performed. No intravenous contrast was administered.   COMPARISON:  None Available.   FINDINGS: Segmentation: 5 lumbar type vertebrae based on the available coverage   Alignment:  Normal   Vertebrae:  No fracture, evidence of discitis, or bone lesion.  Conus medullaris and cauda equina: Conus extends to the L2 level. Conus and cauda equina appear normal.   Paraspinal and other soft tissues: Right hepatic cystic intensity, small and non worrisome.   Disc  levels:   Diffusely preserved disc height and hydration. T2 hyperintensity posterior and lateral to the right facet which appears distinct from it and is likely small lymphangioma or varix. No noted facet degeneration. No neural impingement.   IMPRESSION: Negative for neural impingement or visible inflammation.     Electronically Signed   By: Dorn Roulette M.D.   On: 07/19/2022 08:26     Objective:  VS:  HT:    WT:   BMI:     BP:115/71  HR:66bpm  TEMP: ( )  RESP:  Physical Exam Vitals and nursing note reviewed.  Constitutional:      General: She is not in acute distress.    Appearance: Normal appearance. She is not ill-appearing.  HENT:     Head: Normocephalic and atraumatic.     Right Ear: External ear normal.     Left Ear: External ear normal.  Eyes:     Extraocular Movements: Extraocular movements intact.  Cardiovascular:     Rate and Rhythm: Normal rate.     Pulses: Normal pulses.  Pulmonary:     Effort: Pulmonary effort is normal. No respiratory distress.  Abdominal:     General: There is no distension.     Palpations: Abdomen is soft.  Musculoskeletal:        General: Tenderness present.     Cervical back: Neck supple.     Right lower leg: No edema.     Left lower leg: No edema.     Comments: Patient has good distal strength with no pain over the greater trochanters.  No clonus or focal weakness.  Skin:    Findings: No erythema, lesion or rash.  Neurological:     General: No focal deficit present.     Mental Status: She is alert and oriented to person, place, and time.     Sensory: No sensory deficit.     Motor: No weakness or abnormal muscle tone.     Coordination: Coordination normal.  Psychiatric:        Mood and Affect: Mood normal.        Behavior: Behavior normal.      Imaging: No results found. "

## 2024-12-10 NOTE — Procedures (Signed)
 Lumbar Epidural Steroid Injection - Interlaminar Approach with Fluoroscopic Guidance  Patient: Ashlee Booth      Date of Birth: [birth year] MRN: [id number] PCP: Darene Josette POUR, NP      Visit Date: 12/10/2024   Universal Protocol:     Consent Given By: the patient  Position: PRONE  Additional Comments: Vital signs were monitored before and after the procedure. Patient was prepped and draped in the usual sterile fashion. The correct patient, procedure, and site was verified.   Injection Procedure Details:   Procedure diagnoses: Lumbar radiculopathy [M54.16]   Meds Administered:  Meds ordered this encounter  Medications   methylPREDNISolone  acetate (DEPO-MEDROL ) injection 40 mg     Laterality: Right  Location/Site:  L5-S1  Needle: 3.5 in., 20 ga. Tuohy  Needle Placement: Paramedian epidural  Findings:   -Comments: Excellent flow of contrast into the epidural space.  Procedure Details: Using a paramedian approach from the side mentioned above, the region overlying the inferior lamina was localized under fluoroscopic visualization and the soft tissues overlying this structure were infiltrated with 4 ml. of 1% Lidocaine  without Epinephrine. The Tuohy needle was inserted into the epidural space using a paramedian approach.   The epidural space was localized using loss of resistance along with counter oblique bi-planar fluoroscopic views.  After negative aspirate for air, blood, and CSF, a 2 ml. volume of Isovue-250 was injected into the epidural space and the flow of contrast was observed. Radiographs were obtained for documentation purposes.    The injectate was administered into the level noted above.   Additional Comments:  The patient tolerated the procedure well Dressing: 2 x 2 sterile gauze and Band-Aid    Post-procedure details: Patient was observed during the procedure. Post-procedure instructions were reviewed.  Patient left the clinic in stable  condition.

## 2024-12-24 ENCOUNTER — Ambulatory Visit: Admitting: Physical Medicine and Rehabilitation

## 2024-12-24 ENCOUNTER — Encounter: Payer: Self-pay | Admitting: Physical Medicine and Rehabilitation

## 2024-12-24 ENCOUNTER — Telehealth: Payer: Self-pay

## 2024-12-24 DIAGNOSIS — G8929 Other chronic pain: Secondary | ICD-10-CM

## 2024-12-24 DIAGNOSIS — M797 Fibromyalgia: Secondary | ICD-10-CM | POA: Diagnosis not present

## 2024-12-24 DIAGNOSIS — M5441 Lumbago with sciatica, right side: Secondary | ICD-10-CM | POA: Diagnosis not present

## 2024-12-24 DIAGNOSIS — M5442 Lumbago with sciatica, left side: Secondary | ICD-10-CM | POA: Diagnosis not present

## 2024-12-24 DIAGNOSIS — M7918 Myalgia, other site: Secondary | ICD-10-CM

## 2024-12-24 NOTE — Telephone Encounter (Signed)
 Patient called stating that she has been feeling bad since receiving injection.  Stated that she has been having back spasms that are radiating down into her feet and that her thighs and legs feel very heavy and having a lot of pain in her back.  Would like a CB to discuss.  CB# [phone].  Please advise.  Thank you

## 2024-12-24 NOTE — Progress Notes (Unsigned)
 "  Ashlee Booth - 44 y.o. female MRN [id number]  Date of birth: 1981/10/24  Office Visit Note: Visit Date: 12/24/2024 PCP: Darene Josette POUR, NP Referred by: Darene Josette POUR, NP  Subjective: Chief Complaint  Patient presents with   Lower Back - Pain   HPI: Ashlee Booth is a 44 y.o. female who comes in today /for evaluation of chronic, worsening and severe bilateral lower back pain radiating down both legs.She was initially referred to us  by Rocky Hans, NP. Pain ongoing for several years, worsens with activity and standing. She describes pain as numbness/tingling, tightness, soreness and cramping, currently rates as 8 out of 10. Some relief of pain with home exercise regimen, rest and use of medications. Recent lumbar MRI imaging shows mild lower lumbar facet hypertrophy at L4-5 and L5-S1. There is no significant nerve impingement, no high grade spinal canal stenosis. She underwent left L5-S1 interlaminar epidural steroid injection in our office on 06/30/2022, no relief of pain with this procedure. More recently, she underwent right L5-S1 interlaminar epidural steroid injection on 12/10/2024. She feels this procedure caused her pain to become worse. She is currently working 2 jobs, [employer] during the day and pain company at night. Patient denies focal weakness. No recent trauma or falls. No bowel/bladder incontinence.     .      Review of Systems  Musculoskeletal:  Positive for back pain, joint pain and myalgias.  Neurological:  Positive for tingling. Negative for focal weakness and weakness.  All other systems reviewed and are negative.  Otherwise per HPI.  Assessment & Plan: Visit Diagnoses:    ICD-10-CM   1. Chronic bilateral low back pain with bilateral sciatica  M54.42 Ambulatory referral to Physical Therapy   M54.41    G89.29     2. Myofascial pain syndrome  M79.18 Ambulatory referral to Physical Therapy    3. Fibromyalgia  M79.7 Ambulatory referral to Physical Therapy        Plan: Findings:  Chronic, worsening and severe bilateral lower back pain radiating down both legs. Patient continues to have severe pain despite good conservative therapies such as home exercise regimen, rest and use of medications. Patients clinical presentation and exam do not directly correlate with recent lumbar MRI imaging. Her pain radiating down the legs if more non dermatomal. There is no significant nerve compression noted, no central canal stenosis. I do think her pain is more myofascial in nature. Diffuse myofascial tenderness to bilateral thoracic and lumbar paraspinal regions, also bilateral legs. I had her complete fibromyalgia screening in the office today. Her widespread pain index and symptom severity scores do meet diagnostic criteria for fibromyalgia. We briefly discussed treatment of fibromyalgia today, including adequate sleep, stress reduction and physical exercise. She can speak with her PCP regarding further treatment, could look at medications such as Cymbalta. I did place order for short course of formal physical therapy. I also provided her with work note. She can follow up with Rocky as needed to discuss any further orthopedic sources of pain. I would not recommend repeating interventional spine procedures at this time. She has no questions. No red flag symptoms noted upon exam today.     Meds & Orders: No orders of the defined types were placed in this encounter.   Orders Placed This Encounter  Procedures   Ambulatory referral to Physical Therapy    Follow-up: Return if symptoms worsen or fail to improve.   Procedures: No procedures performed      Clinical History: MRI  LUMBAR SPINE WITHOUT CONTRAST   TECHNIQUE: Multiplanar, multisequence MR imaging of the lumbar spine was performed. No intravenous contrast was administered.   COMPARISON:  Prior MRI from 07/17/2022.   FINDINGS: Segmentation: Standard. Lowest well-formed disc space labeled the L5-S1 level.    Alignment: Physiologic with preservation of the normal lumbar lordosis. No listhesis.   Vertebrae: Vertebral body height maintained without acute or chronic fracture. Bone marrow signal intensity within normal limits. Few small benign hemangiomata noted. No worrisome osseous lesions. No abnormal marrow edema.   Conus medullaris and cauda equina: Conus extends to the L1-2 level. Conus and cauda equina appear normal.   Paraspinal and other soft tissues: Paraspinous soft tissues within normal limits. Few small cyst noted within the liver, largest of which measures 1.6 cm at the posterior right hepatic lobe.   Disc levels:   No significant disc pathology seen within the lumbar spine. Intervertebral discs are well hydrated with preserved disc height. No significant disc bulge or focal disc herniation. Mild bilateral facet hypertrophy at L4-5 and L5-S1. No significant canal or foraminal stenosis or neural impingement.   IMPRESSION: 1. Mild lower lumbar facet hypertrophy at L4-5 and L5-S1. 2. Otherwise unremarkable and normal MRI of the lumbar spine. No significant disc pathology, stenosis, or evidence for neural impingement.     Electronically Signed   By: Morene Hoard M.D.   On: 11/11/2024 02:04   She reports that she has never smoked. She has never used smokeless tobacco. No results for input(s): HGBA1C, LABURIC in the last 8760 hours.  Objective:  VS:  HT:    WT:   BMI:     BP:   HR: bpm  TEMP: ( )  RESP:  Physical Exam Vitals and nursing note reviewed.  HENT:     Head: Normocephalic and atraumatic.     Right Ear: External ear normal.     Left Ear: External ear normal.     Nose: Nose normal.     Mouth/Throat:     Mouth: Mucous membranes are moist.  Eyes:     Extraocular Movements: Extraocular movements intact.  Cardiovascular:     Rate and Rhythm: Normal rate.     Pulses: Normal pulses.  Pulmonary:     Effort: Pulmonary effort is normal.   Abdominal:     General: Abdomen is flat. There is no distension.  Musculoskeletal:        General: Tenderness present.     Cervical back: Normal range of motion.     Comments: Patient rises from seated position to standing without difficulty. Good lumbar range of motion. No pain noted with facet loading. 5/5 strength noted with bilateral hip flexion, knee flexion/extension, ankle dorsiflexion/plantarflexion and EHL. No clonus noted bilaterally. No pain upon palpation of greater trochanters. No pain with internal/external rotation of bilateral hips. Sensation intact bilaterally. Myofascial tenderness noted upon palpation of thoracic and lumbar spine, also bilateral leg. Negative slump test bilaterally. Ambulates without aid, gait steady.     Skin:    General: Skin is warm and dry.     Capillary Refill: Capillary refill takes less than 2 seconds.  Neurological:     General: No focal deficit present.     Mental Status: She is alert and oriented to person, place, and time.  Psychiatric:        Mood and Affect: Mood normal.        Behavior: Behavior normal.     Ortho Exam  Imaging: No results found.  Past Medical/Family/Surgical/Social History: Medications & Allergies reviewed per EMR, new medications updated. 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 Past Surgical History:  Procedure Laterality Date   DILATATION & CURETTAGE/HYSTEROSCOPY WITH MYOSURE N/A 02/01/2024   Procedure: DILATATION & CURETTAGE/HYSTEROSCOPY WITH MYOSURE;  Surgeon: Storm Setter, DO;  Location: [facility] OR;  Service: Gynecology;  Laterality: N/A;  rep to cover case.  Confirmed on 2/17 CS   TUBAL LIGATION     Social History    Occupational History   Not on file  Tobacco Use   Smoking status: Never   Smokeless tobacco: Never  Vaping Use   Vaping status: Never Used  Substance and Sexual Activity   Alcohol use: Not Currently   Drug use: No   Sexual activity: Not on file   "

## 2024-12-24 NOTE — Progress Notes (Unsigned)
 Pain Scale   Average Pain 8 Patient advising she has chronic lower back to bilateral legs .        +Driver, -BT, -Dye Allergies.

## 2024-12-25 ENCOUNTER — Other Ambulatory Visit: Payer: Self-pay | Admitting: Physical Medicine and Rehabilitation

## 2024-12-25 ENCOUNTER — Telehealth: Payer: Self-pay | Admitting: Physical Medicine and Rehabilitation

## 2024-12-25 MED ORDER — HYDROCODONE-ACETAMINOPHEN 5-325 MG PO TABS: 1.0000 | ORAL_TABLET | Freq: Three times a day (TID) | ORAL | 0 refills | Status: AC | PRN

## 2024-12-25 NOTE — Telephone Encounter (Signed)
Patient called. She would like some pain medication called in.

## 2024-12-26 ENCOUNTER — Ambulatory Visit: Admitting: Physical Medicine and Rehabilitation

## 2024-12-26 ENCOUNTER — Ambulatory Visit (INDEPENDENT_AMBULATORY_CARE_PROVIDER_SITE_OTHER): Admitting: Family

## 2024-12-26 ENCOUNTER — Encounter: Payer: Self-pay | Admitting: Family

## 2024-12-26 DIAGNOSIS — M549 Dorsalgia, unspecified: Secondary | ICD-10-CM | POA: Diagnosis not present

## 2024-12-26 DIAGNOSIS — M79605 Pain in left leg: Secondary | ICD-10-CM | POA: Diagnosis not present

## 2024-12-26 DIAGNOSIS — M79604 Pain in right leg: Secondary | ICD-10-CM | POA: Diagnosis not present

## 2024-12-26 DIAGNOSIS — G8929 Other chronic pain: Secondary | ICD-10-CM | POA: Diagnosis not present

## 2024-12-26 MED ORDER — METHOCARBAMOL 500 MG PO TABS: 500.0000 mg | ORAL_TABLET | Freq: Four times a day (QID) | ORAL | 0 refills | Status: DC | PRN

## 2024-12-26 NOTE — Progress Notes (Signed)
 "  Office Visit Note   Patient: Ashlee Booth           Date of Birth: 13-Jun-1981           MRN: [id number] Visit Date: 12/26/2024              Requested by: Darene Josette POUR, NP [address] [place],  [place] [address] PCP: Darene Josette POUR, NP  Chief Complaint  Patient presents with   Right Leg - Pain   Left Leg - Pain      HPI: The patient is a 44 year old woman who presents in follow-up for ongoing issues with her bilateral lower extremities as well as some low back pain.  She endorses diffuse bilateral lower extremity pain described as tightness, especially in the bilateral thighs again poorly localized with prolonged standing.  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  She complains of associated cramping in her proximal quads as well as some cramping pain in the plantar aspect of bilateral feet this is not associated with any sort of activity this occurs at rest as well as with weightbearing  She endorses some tingling/paresthesias plus pins-and-needles feelings in the thighs as well as the feet again not associated with any particular activity.  Some difficulty getting comfortable in bed however lying flat does not aggravate her symptoms directly  No relief from prednisone  burst.  Has had epidural steroid injections with Dr. Eldonna the first injection provided no relief the second injection she felt worsened her symptoms  Per NP Williams: currently rates as 8 out of 10. Some relief of pain with home exercise regimen, rest and use of medications. Recent lumbar MRI imaging shows mild lower lumbar facet hypertrophy at L4-5 and L5-S1. There is no significant nerve impingement, no high grade spinal canal stenosis. She  underwent left L5-S1 interlaminar epidural steroid injection in our office on 06/30/2022, no relief of pain with this procedure. More recently, she underwent right L5-S1 interlaminar epidural steroid injection on 12/10/2024. She feels this procedure caused her pain to become worse. She is currently working 2 jobs, [employer] during the day and pain company at night. Patient denies focal weakness. No recent trauma or falls. No bowel/bladder incontinence.   Continues to feel unable to return to work due to pain  Assessment & Plan: Visit Diagnoses:  1. Bilateral leg pain     Plan: Diffuse myofascial tenderness to bilateral thoracic, lumbar paraspinal regions as well as bilateral lower extremities.  She has previously been screened for and met criteria for fibromyalgia.  We will proceed with rheumatology referral as well as lab screening.    Will continue her out of work for the next 4 weeks as she completes her physical therapy, referral has been placed.  Follow-Up Instructions: No follow-ups on file.   Back Exam   Tenderness  Back tenderness location: no spinous process tenderness.  Range of Motion  The patient has normal back ROM.  Muscle Strength  The patient has normal back strength.  Tests  Straight leg raise right: negative Straight leg raise left: negative  Comments:  Myofascial tenderness to thoracic and lumbar regions, bilateral LE tenderness      Patient is alert, oriented, no adenopathy, well-dressed,  normal affect, normal respiratory effort.     Imaging: No results found. No images are attached to the encounter.  Labs: Lab Results  Component Value Date   REPTSTATUS 03/21/2019 FINAL 03/20/2019   CULT  03/20/2019    NO GROWTH Performed at [facility], [address], [place], [place] [address]      Lab Results  Component Value Date   ALBUMIN 4.1 01/03/2023   ALBUMIN 4.3 03/29/2021   ALBUMIN 4.2 03/12/2021    No results found for: MG No results  found for: VD25OH  No results found for: PREALBUMIN    Latest Ref Rng & Units 02/01/2024   10:29 AM 01/03/2023    5:47 PM 04/08/2021    8:18 AM  CBC EXTENDED  WBC 4.0 - 10.5 K/uL 4.9  6.3  3.7   RBC 3.87 - 5.11 MIL/uL 4.67  4.75  4.68   Hemoglobin 12.0 - 15.0 g/dL 85.6  85.8  85.6   HCT 36.0 - 46.0 % 41.5  42.7  42.4   Platelets 150 - 400 K/uL 241  235  246   NEUT# 1.7 - 7.7 K/uL   1.7   Lymph# 0.7 - 4.0 K/uL   1.3      There is no height or weight on file to calculate BMI.  Orders:  Orders Placed This Encounter  Procedures   Sed Rate (ESR)   Uric acid   C-reactive protein   Rheumatoid Factor   ANA   Meds ordered this encounter  Medications   methocarbamol  (ROBAXIN ) 500 MG tablet    Sig: Take 1 tablet (500 mg total) by mouth every 6 (six) hours as needed for muscle spasms.    Dispense:  30 tablet    Refill:  0     Procedures: No procedures performed  Clinical Data: No additional findings.  ROS:  All other systems negative, except as noted in the HPI. Review of Systems  Objective: Vital Signs: There were no vitals taken for this visit.  Specialty Comments:  MRI LUMBAR SPINE WITHOUT CONTRAST   TECHNIQUE: Multiplanar, multisequence MR imaging of the lumbar spine was performed. No intravenous contrast was administered.   COMPARISON:  Prior MRI from 07/17/2022.   FINDINGS: Segmentation: Standard. Lowest well-formed disc space labeled the L5-S1 level.   Alignment: Physiologic with preservation of the normal lumbar lordosis. No listhesis.   Vertebrae: Vertebral body height maintained without acute or chronic fracture. Bone marrow signal intensity within normal limits. Few small benign hemangiomata noted. No worrisome osseous lesions. No abnormal marrow edema.   Conus medullaris and cauda equina: Conus extends to the L1-2 level. Conus and cauda equina appear normal.   Paraspinal and other soft tissues: Paraspinous soft tissues within normal  limits. Few small cyst noted within the liver, largest of which measures 1.6 cm at the posterior right hepatic lobe.   Disc levels:   No significant disc pathology seen within the lumbar spine. Intervertebral discs are well hydrated with preserved disc height. No significant disc bulge or focal disc herniation. Mild bilateral facet hypertrophy at L4-5 and L5-S1. No significant canal or foraminal stenosis or neural impingement.   IMPRESSION: 1. Mild lower lumbar facet hypertrophy at L4-5 and L5-S1. 2. Otherwise unremarkable and normal MRI of the lumbar spine. No significant disc pathology, stenosis, or evidence for neural impingement.     Electronically Signed   By: Morene Hoard M.D.   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  Past Surgical History:  Procedure Laterality Date   DILATATION & CURETTAGE/HYSTEROSCOPY WITH MYOSURE N/A 02/01/2024   Procedure: DILATATION & CURETTAGE/HYSTEROSCOPY WITH MYOSURE;  Surgeon: Storm Setter, DO;  Location: [facility] OR;  Service: Gynecology;  Laterality: N/A;  rep to cover case.  Confirmed on 2/17 CS   TUBAL LIGATION     Social History   Occupational History   Not on file  Tobacco Use   Smoking status: Never   Smokeless tobacco: Never  Vaping Use   Vaping status: Never Used  Substance and Sexual Activity   Alcohol use: Not Currently   Drug use: No   Sexual activity: Not on file       "

## 2024-12-27 LAB — RHEUMATOID FACTOR: Rheumatoid fact SerPl-aCnc: <10 [IU]/mL

## 2024-12-27 LAB — SEDIMENTATION RATE: Sed Rate: 6 mm/h (ref 0–20)

## 2024-12-27 LAB — EXTRA SPECIMEN

## 2024-12-27 LAB — C-REACTIVE PROTEIN: CRP: <3 mg/L

## 2024-12-27 LAB — URIC ACID: Uric Acid, Serum: 5.3 mg/dL (ref 2.5–7.0)

## 2024-12-28 ENCOUNTER — Ambulatory Visit: Payer: Self-pay | Admitting: Family

## 2024-12-28 NOTE — Therapy (Signed)
 " OUTPATIENT PHYSICAL THERAPY THORACOLUMBAR EVALUATION   Patient Name: Ashlee Booth MRN: [id number] DOB:16-Jul-1981, 44 y.o., female Today's Date: 12/31/2024  END OF SESSION:  PT End of Session - 12/31/24 0916     Visit Number 1    Number of Visits 6    Date for Recertification  02/28/25    Authorization Type BC[employer]    PT Start Time 0745    PT Stop Time 0830    PT Time Calculation (min) 45 min    Activity Tolerance Patient tolerated treatment well    Behavior During Therapy [facility] for tasks assessed/performed          Past Medical History:  Diagnosis Date   Herpes genitalia    Past Surgical History:  Procedure Laterality Date   DILATATION & CURETTAGE/HYSTEROSCOPY WITH MYOSURE N/A 02/01/2024   Procedure: DILATATION & CURETTAGE/HYSTEROSCOPY WITH MYOSURE;  Surgeon: Storm Setter, DO;  Location: [facility] OR;  Service: Gynecology;  Laterality: N/A;  rep to cover case.  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    PCP: Darene Josette POUR, NP   REFERRING PROVIDER: Trudy Duwaine BRAVO, NP  REFERRING DIAG: M54.42,M54.41,G89.29 (ICD-10-CM) - Chronic bilateral low back pain with bilateral sciatica M79.18 (ICD-10-CM) - Myofascial pain syndrome M79.7 (ICD-10-CM) - Fibromyalgia  Rationale for Evaluation and Treatment: Rehabilitation  THERAPY DIAG:  Other low back pain  Muscle weakness (generalized)  Myofascial pain  ONSET DATE: chronic  SUBJECTIVE:                                                                                                                                                                                           SUBJECTIVE STATEMENT: HPI: Ashlee Booth is a 44 y.o. female who comes in today /for evaluation of chronic,  worsening and severe bilateral lower back pain radiating down both legs.She was initially referred to us  by Ashlee Hans, NP. Pain ongoing for several years, worsens with activity and standing. She describes pain as numbness/tingling, tightness, soreness and cramping, currently rates as 8 out of 10. Some relief of pain with home exercise regimen, rest and use of medications. Recent lumbar MRI imaging shows mild lower lumbar facet hypertrophy at L4-5 and L5-S1. There is no significant nerve impingement, no high grade spinal canal stenosis. She underwent left L5-S1 interlaminar epidural steroid injection in our office on 06/30/2022, no relief of pain with this procedure.  More recently, she underwent right L5-S1 interlaminar epidural steroid injection on 12/10/2024. She feels this procedure caused her pain to become worse. She is currently working 2 jobs, [employer] during the day and pain company at night. Patient denies focal weakness. No recent trauma or falls. No bowel/bladder incontinence.   PERTINENT HISTORY:  Chronic, worsening and severe bilateral lower back pain radiating down both legs. Patient continues to have severe pain despite good conservative therapies such as home exercise regimen, rest and use of medications. Patients clinical presentation and exam do not directly correlate with recent lumbar MRI imaging. Her pain radiating down the legs if more non dermatomal. There is no significant nerve compression noted, no central canal stenosis. I do think her pain is more myofascial in nature. Diffuse myofascial tenderness to bilateral thoracic and lumbar paraspinal regions, also bilateral legs. I had her complete fibromyalgia screening in the office today. Her widespread pain index and symptom severity scores do meet diagnostic criteria for fibromyalgia. We briefly discussed treatment of fibromyalgia today, including adequate sleep, stress reduction and physical exercise. She can speak with her PCP regarding  further treatment, could look at medications such as Cymbalta. I did place order for short course of formal physical therapy. I also provided her with work note. She can follow up with Ashlee as needed to discuss any further orthopedic sources of pain. I would not recommend repeating interventional spine procedures at this time. She has no questions. No red flag symptoms noted upon exam today.   PAIN:  Are you having pain? Yes: NPRS scale: 8/10 Pain location: low back Pain description: spasms, tingling Aggravating factors: prolonged standing Relieving factors: lying supine  PRECAUTIONS: None  RED FLAGS: None   WEIGHT BEARING RESTRICTIONS: No  FALLS:  Has patient fallen in last 6 months? No  OCCUPATION: [employer]  PLOF: Independent  PATIENT GOALS: To manage my pain  NEXT MD VISIT: TBD  OBJECTIVE:  Note: Objective measures were completed at Evaluation unless otherwise noted.  DIAGNOSTIC FINDINGS:  Booth, Ashlee K, NP   PATIENT SURVEYS:  Patient-specific activity scoring scheme (Point to one number):  0 represents unable to perform. 10 represents able to perform at prior level. 0 1 2 3 4 5 6 7 8 9  10 (Date and Score) Activity Initial  Activity Eval     Prolonged sitting/standing  8    Bending  9    Bed mobility/core tasks 7     Total score = sum of the activity scores/number of activities Minimum detectable change (90%CI) for average score = 2 points Minimum detectable change (90%CI) for single activity score = 3 points PSFS developed by: Rosalee MYRTIS Marvis KYM Charlet CHRISTELLA., & Binkley, J. (1995). Assessing disability and change on individual  patients: a report of a patient specific measure. [employer] [place], 47, 741-736. Reproduced with the permission of the authors  Score: 24/30   MUSCLE LENGTH: Hamstrings: Right 70 deg; Left 70 deg Thomas test: NT  POSTURE: No Significant postural limitations  PALPATION: deferred  LUMBAR ROM:   AROM eval   Flexion 90%  Extension 75%  Right lateral flexion 75%  Left lateral flexion 75%  Right rotation 50%  Left rotation 50%   (Blank rows = not tested)  LOWER EXTREMITY ROM:   WNL  Active  Right eval Left eval  Hip flexion    Hip extension    Hip abduction    Hip adduction    Hip internal rotation    Hip external rotation  Knee flexion    Knee extension    Ankle dorsiflexion    Ankle plantarflexion    Ankle inversion    Ankle eversion     (Blank rows = not tested)  LOWER EXTREMITY MMT:  see 30s chair stand  MMT Right eval Left eval  Hip flexion    Hip extension    Hip abduction    Hip adduction    Hip internal rotation    Hip external rotation    Knee flexion    Knee extension    Ankle dorsiflexion    Ankle plantarflexion    Ankle inversion    Ankle eversion     (Blank rows = not tested)  LUMBAR SPECIAL TESTS:  Straight leg raise test: Negative and Slump test: Negative  FUNCTIONAL TESTS:  30 seconds chair stand test  GAIT: Distance walked: 36ft x2 Assistive device utilized: None Level of assistance: Complete Independence   TREATMENT:                                                                                                                             [facility] Adult PT Treatment:                                                DATE: 12/31/24 Eval and HEP Self Care: Additional minutes spent for educating on updated Therapeutic Home Exercise Program as well as comparing current status to condition at start of symptoms. This included exercises focusing on stretching, strengthening, with focus on eccentric aspects. Long term goals include an improvement in range of motion, strength, endurance as well as avoiding reinjury. Patient's frequency would include in 1-2 times a day, 3-5 times a week for a duration of 6-12 weeks. Proper technique shown and discussed handout in great detail. All questions were discussed and addressed.      PATIENT EDUCATION:   Education details: Discussed eval findings, rehab rationale and POC and patient is in agreement  Person educated: Patient Education method: Explanation and Handouts Education comprehension: verbalized understanding and needs further education  HOME EXERCISE PROGRAM: Access Code: [email or web address] URL: [email or web address] Date: 12/31/2024 Prepared by: Reyes Kohut  Exercises - Static Prone on Elbows  - 1 x daily - 3-5 x weekly - 1 sets - 1 reps - 2 min hold - Seated Table Hamstring Stretch  - 1 x daily - 3-5 x weekly - 1 sets - 2 reps - 30s hold - Supine 90/90 Abdominal Bracing  - 1 x daily - 3-5 x weekly - 1 sets - 2 reps - 30-60s hold  ASSESSMENT:  CLINICAL IMPRESSION: Patient is a 44 y.o. female who was seen today for physical therapy evaluation and treatment for low back pain and BLE paresthesias.  Neural tension signs negative but marked myofascial restrictions noted with assessment and various testing procedures.  Patient does show some weakness in core and LE's as evidenced by 90/90 and 30s chair stand test.  Patient would benefit from OPPT with the goal of establishing a stretching program as well as improving core and LE strength  OBJECTIVE IMPAIRMENTS: decreased activity tolerance, decreased knowledge of condition, increased fascial restrictions, impaired perceived functional ability, increased muscle spasms, and pain.   ACTIVITY LIMITATIONS: carrying, lifting, sitting, and standing  PERSONAL FACTORS: Age, Fitness, Time since onset of injury/illness/exacerbation, and 1 comorbidity: fibromyalgia are also affecting patient's functional outcome.   REHAB POTENTIAL: Fair based on diagnosis of myofascial pain  CLINICAL DECISION MAKING: Stable/uncomplicated  EVALUATION COMPLEXITY: Moderate   GOALS: Goals reviewed with patient? No  SHORT TERM GOALS: Target date: 01/28/2025    Patient to demonstrate independence in HEP  Baseline: [id number] Goal status:  INITIAL  2.  80d SLR B Baseline: 70d SLR B Goal status: INITIAL    LONG TERM GOALS: Target date: 02/25/2025    Patient will acknowledge 4/10 pain at least once during episode of care   Baseline: 8/10 Goal status: INITIAL  2.  Patient will increase 30s chair stand reps from 5 to 8 without arms to demonstrate and improved functional ability with less pain/difficulty as well as reduce fall risk.  Baseline: 5 Goal status: INITIAL  3.  Patient will score at least 27/30 on PSFS to signify clinically meaningful improvement in functional abilities.   Baseline: 24/30 Goal status: INITIAL  4.  Increase trunk AROM to 90% Baseline:  AROM eval  Flexion 90%  Extension 75%  Right lateral flexion 75%  Left lateral flexion 75%  Right rotation 50%  Left rotation 50%   Goal status: INITIAL    PLAN:  PT FREQUENCY: 1-2x/week  PT DURATION: 6 weeks  PLANNED INTERVENTIONS: 97110-Therapeutic exercises, 97530- Therapeutic activity, [id number]- Neuromuscular re-education, 97535- Self Care, [id number]- Manual therapy, and Patient/Family education.  PLAN FOR NEXT SESSION: HEP review and update, manual techniques as appropriate, aerobic tasks, ROM and flexibility activities, strengthening and PREs, TPDN, gait and balance training,aquatic therapy, modalities for pain and NMRE     For all possible CPT codes, reference the Planned Interventions line above.     Check all conditions that are expected to impact treatment: {Conditions expected to impact treatment:None of these apply   If treatment provided at initial evaluation, no treatment charged due to lack of authorization.       Eladia Frame M Shalaya Swailes, PT 12/31/2024, 9:17 AM  "

## 2024-12-28 NOTE — Progress Notes (Signed)
 Lab work all unremarkable. Lets have her follow with PCP unless Samule can get her in with rheum

## 2024-12-31 ENCOUNTER — Ambulatory Visit: Attending: Physical Medicine and Rehabilitation

## 2024-12-31 ENCOUNTER — Other Ambulatory Visit: Payer: Self-pay

## 2024-12-31 DIAGNOSIS — M6281 Muscle weakness (generalized): Secondary | ICD-10-CM | POA: Diagnosis present

## 2024-12-31 DIAGNOSIS — M5441 Lumbago with sciatica, right side: Secondary | ICD-10-CM | POA: Diagnosis not present

## 2024-12-31 DIAGNOSIS — G8929 Other chronic pain: Secondary | ICD-10-CM | POA: Insufficient documentation

## 2024-12-31 DIAGNOSIS — M797 Fibromyalgia: Secondary | ICD-10-CM | POA: Diagnosis not present

## 2024-12-31 DIAGNOSIS — M5442 Lumbago with sciatica, left side: Secondary | ICD-10-CM | POA: Insufficient documentation

## 2024-12-31 DIAGNOSIS — M5459 Other low back pain: Secondary | ICD-10-CM | POA: Diagnosis present

## 2024-12-31 DIAGNOSIS — M7918 Myalgia, other site: Secondary | ICD-10-CM | POA: Diagnosis present

## 2025-01-02 ENCOUNTER — Telehealth: Payer: Self-pay | Admitting: Family

## 2025-01-02 NOTE — Telephone Encounter (Signed)
 Received

## 2025-01-02 NOTE — Telephone Encounter (Signed)
 Pt submitted medical release form, Short term forms, and 20.00 payment

## 2025-01-07 ENCOUNTER — Ambulatory Visit

## 2025-01-14 ENCOUNTER — Ambulatory Visit

## 2025-01-15 ENCOUNTER — Ambulatory Visit: Admitting: Family

## 2025-01-16 ENCOUNTER — Ambulatory Visit: Admitting: Family

## 2025-01-16 ENCOUNTER — Encounter: Payer: Self-pay | Admitting: Family

## 2025-01-16 DIAGNOSIS — M549 Dorsalgia, unspecified: Secondary | ICD-10-CM

## 2025-01-16 DIAGNOSIS — M79604 Pain in right leg: Secondary | ICD-10-CM | POA: Diagnosis not present

## 2025-01-16 DIAGNOSIS — M7918 Myalgia, other site: Secondary | ICD-10-CM

## 2025-01-16 DIAGNOSIS — M79605 Pain in left leg: Secondary | ICD-10-CM | POA: Diagnosis not present

## 2025-01-16 DIAGNOSIS — M797 Fibromyalgia: Secondary | ICD-10-CM

## 2025-01-16 DIAGNOSIS — G8929 Other chronic pain: Secondary | ICD-10-CM | POA: Diagnosis not present

## 2025-01-16 MED ORDER — DULOXETINE HCL 20 MG PO CPEP: 20.0000 mg | ORAL_CAPSULE | Freq: Every day | ORAL | 1 refills | Status: AC

## 2025-01-16 MED ORDER — METHOCARBAMOL 500 MG PO TABS: 500.0000 mg | ORAL_TABLET | Freq: Four times a day (QID) | ORAL | 0 refills | Status: AC | PRN

## 2025-01-16 NOTE — Progress Notes (Signed)
 "  Office Visit Note   Patient: Ashlee Booth           Date of Birth: 12-Jun-1981           MRN: [id number] Visit Date: 01/16/2025              Requested by: Darene Josette POUR, NP [address] [place],  [place] [address] PCP: Darene Josette POUR, NP  Chief Complaint  Patient presents with   Lower Back - Follow-up      HPI: The patient is a 44 year old woman who is seen today in follow-up for ongoing bilateral shoulder pain bilateral lower extremity pain which is diffuse she describes the bilateral leg pain as  my legs feel like sand bags this is associated with paresthesias is worse with prolonged standing  She felt that her symptoms were primarily related to the work she does which is physical that she must be on her feet lifting pushing pulling and carrying throughout her shift she does work 2 jobs however she has been out of work for the last month or more and she feels that she has not noticed any improvement in her symptoms with rest  She is feeling quite frustrated she does wants to understand why she is feeling so exhausted all the time as well as ongoing pain  She would not like to pursue any pain management or take pain medications  Has previously been referred to physical therapy she is only had 1 visit she reports several of her visits were canceled due to recent winter weather  Assessment & Plan: Visit Diagnoses:  1. Bilateral leg pain   2. Chronic back pain greater than 3 months duration   3. Myofascial pain syndrome   4. Fibromyalgia     Plan: Lengthy discussion, discussed myofascial pain syndrome as well as fibromyalgia.  We will trial her on a 4-week course of Cymbalta .  She will also use a muscle relaxer intermittently have placed referral to physical medicine rehab.  Would appreciate their input  Encouraged her to continue with physical therapy  Follow-Up Instructions: No follow-ups on file.   Ortho Exam  Patient is alert, oriented, no  adenopathy, well-dressed, normal affect, normal respiratory effort. Unchanged.   Imaging: No results found. No images are attached to the encounter.  Labs: Lab Results  Component Value Date   ESRSEDRATE 6 12/26/2024   CRP <3.0 12/26/2024   LABURIC 5.3 12/26/2024   REPTSTATUS 03/21/2019 FINAL 03/20/2019   CULT  03/20/2019    NO GROWTH Performed at [facility], [address], [place], [place] [address]      Lab Results  Component Value Date   ALBUMIN 4.1 01/03/2023   ALBUMIN 4.3 03/29/2021   ALBUMIN 4.2 03/12/2021    No results found for: MG No results found for: VD25OH  No results found for: PREALBUMIN    Latest Ref Rng & Units 02/01/2024   10:29 AM 01/03/2023    5:47 PM 04/08/2021    8:18 AM  CBC EXTENDED  WBC 4.0 - 10.5 K/uL 4.9  6.3  3.7   RBC 3.87 - 5.11 MIL/uL 4.67  4.75  4.68   Hemoglobin 12.0 - 15.0 g/dL 85.6  85.8  85.6   HCT 36.0 - 46.0 % 41.5  42.7  42.4   Platelets 150 - 400 K/uL 241  235  246   NEUT# 1.7 - 7.7 K/uL   1.7   Lymph# 0.7 - 4.0 K/uL  1.3      There is no height or weight on file to calculate BMI.  Orders:  Orders Placed This Encounter  Procedures   Ambulatory referral to Pain Clinic   Meds ordered this encounter  Medications   methocarbamol  (ROBAXIN ) 500 MG tablet    Sig: Take 1 tablet (500 mg total) by mouth every 6 (six) hours as needed for muscle spasms.    Dispense:  30 tablet    Refill:  0   DULoxetine  (CYMBALTA ) 20 MG capsule    Sig: Take 1 capsule (20 mg total) by mouth daily.    Dispense:  30 capsule    Refill:  1     Procedures: No procedures performed  Clinical Data: No additional findings.  ROS:  All other systems negative, except as noted in the HPI. Review of Systems  Objective: Vital Signs: There were no vitals taken for this visit.  Specialty Comments:  MRI LUMBAR SPINE WITHOUT CONTRAST   TECHNIQUE: Multiplanar, multisequence MR imaging of the lumbar spine was performed. No  intravenous contrast was administered.   COMPARISON:  Prior MRI from 07/17/2022.   FINDINGS: Segmentation: Standard. Lowest well-formed disc space labeled the L5-S1 level.   Alignment: Physiologic with preservation of the normal lumbar lordosis. No listhesis.   Vertebrae: Vertebral body height maintained without acute or chronic fracture. Bone marrow signal intensity within normal limits. Few small benign hemangiomata noted. No worrisome osseous lesions. No abnormal marrow edema.   Conus medullaris and cauda equina: Conus extends to the L1-2 level. Conus and cauda equina appear normal.   Paraspinal and other soft tissues: Paraspinous soft tissues within normal limits. Few small cyst noted within the liver, largest of which measures 1.6 cm at the posterior right hepatic lobe.   Disc levels:   No significant disc pathology seen within the lumbar spine. Intervertebral discs are well hydrated with preserved disc height. No significant disc bulge or focal disc herniation. Mild bilateral facet hypertrophy at L4-5 and L5-S1. No significant canal or foraminal stenosis or neural impingement.   IMPRESSION: 1. Mild lower lumbar facet hypertrophy at L4-5 and L5-S1. 2. Otherwise unremarkable and normal MRI of the lumbar spine. No significant disc pathology, stenosis, or evidence for neural impingement.     Electronically Signed   By: Morene Hoard M.D.   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  Past Surgical History:  Procedure Laterality Date   DILATATION & CURETTAGE/HYSTEROSCOPY WITH MYOSURE N/A 02/01/2024   Procedure: DILATATION &  CURETTAGE/HYSTEROSCOPY WITH MYOSURE;  Surgeon: Storm Setter, DO;  Location: [facility] OR;  Service: Gynecology;  Laterality: N/A;  rep to cover case.  Confirmed on 2/17 CS   TUBAL LIGATION     Social History   Occupational History   Not on file  Tobacco Use   Smoking status: Never   Smokeless tobacco: Never  Vaping Use   Vaping status: Never Used  Substance and Sexual Activity   Alcohol use: Not Currently   Drug use: No   Sexual activity: Not on file       "

## 2025-01-18 NOTE — Therapy (Unsigned)
 " OUTPATIENT PHYSICAL THERAPY THORACOLUMBAR EVALUATION   Patient Name: Ashlee Booth MRN: [id number] DOB:[birth year], 44 y.o., female Today's Date: 01/18/2025  END OF SESSION:    Past Medical History:  Diagnosis Date   Herpes genitalia    Past Surgical History:  Procedure Laterality Date   DILATATION & CURETTAGE/HYSTEROSCOPY WITH MYOSURE N/A 02/01/2024   Procedure: DILATATION & CURETTAGE/HYSTEROSCOPY WITH MYOSURE;  Surgeon: Storm Setter, DO;  Location: [facility] OR;  Service: Gynecology;  Laterality: N/A;  rep to cover case.  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  ONSET DATE: chronic  SUBJECTIVE:                                                                                                                                                                                           SUBJECTIVE STATEMENT: HPI: Ashlee Booth is a 44 y.o. female who comes in today /for evaluation of chronic, worsening and severe bilateral lower back pain radiating down both legs.She was initially referred to us  by Rocky Hans, NP. Pain ongoing for several years, worsens with activity and standing. She describes pain as numbness/tingling, tightness, soreness and cramping, currently rates as 8 out of 10. Some relief of pain with home exercise regimen, rest and use of medications. Recent lumbar MRI imaging shows  mild lower lumbar facet hypertrophy at L4-5 and L5-S1. There is no significant nerve impingement, no high grade spinal canal stenosis. She underwent left L5-S1 interlaminar epidural steroid injection in our office on 06/30/2022, no relief of pain with this procedure. More recently, she underwent right L5-S1 interlaminar epidural steroid injection on 12/10/2024. She feels this procedure caused her pain to become worse. She is currently working 2 jobs, [employer] during the day and pain company at night. Patient denies focal weakness. No recent trauma or falls. No bowel/bladder incontinence.   PERTINENT HISTORY:  Chronic, worsening and severe bilateral lower back pain radiating down both legs. Patient continues to have severe pain despite good conservative therapies such as home exercise regimen, rest and use of medications. Patients clinical presentation and exam do  not directly correlate with recent lumbar MRI imaging. Her pain radiating down the legs if more non dermatomal. There is no significant nerve compression noted, no central canal stenosis. I do think her pain is more myofascial in nature. Diffuse myofascial tenderness to bilateral thoracic and lumbar paraspinal regions, also bilateral legs. I had her complete fibromyalgia screening in the office today. Her widespread pain index and symptom severity scores do meet diagnostic criteria for fibromyalgia. We briefly discussed treatment of fibromyalgia today, including adequate sleep, stress reduction and physical exercise. She can speak with her PCP regarding further treatment, could look at medications such as Cymbalta . I did place order for short course of formal physical therapy. I also provided her with work note. She can follow up with Rocky as needed to discuss any further orthopedic sources of pain. I would not recommend repeating interventional spine procedures at this time. She has no questions. No red flag symptoms noted upon exam today.   PAIN:  Are you  having pain? Yes: NPRS scale: 8/10 Pain location: low back Pain description: spasms, tingling Aggravating factors: prolonged standing Relieving factors: lying supine  PRECAUTIONS: None  RED FLAGS: None   WEIGHT BEARING RESTRICTIONS: No  FALLS:  Has patient fallen in last 6 months? No  OCCUPATION: [employer]  PLOF: Independent  PATIENT GOALS: To manage my pain  NEXT MD VISIT: TBD  OBJECTIVE:  Note: Objective measures were completed at Evaluation unless otherwise noted.  DIAGNOSTIC FINDINGS:  Blandford, Kristen K, NP   PATIENT SURVEYS:  Patient-specific activity scoring scheme (Point to one number):  0 represents unable to perform. 10 represents able to perform at prior level. 0 1 2 3 4 5 6 7 8 9  10 (Date and Score) Activity Initial  Activity Eval     Prolonged sitting/standing  8    Bending  9    Bed mobility/core tasks 7     Total score = sum of the activity scores/number of activities Minimum detectable change (90%CI) for average score = 2 points Minimum detectable change (90%CI) for single activity score = 3 points PSFS developed by: Rosalee MYRTIS Marvis KYM Charlet CHRISTELLA., & Binkley, J. (1995). Assessing disability and change on individual  patients: a report of a patient specific measure. [employer] [place], 47, [phone]. Reproduced with the permission of the authors  Score: 24/30   MUSCLE LENGTH: Hamstrings: Right 70 deg; Left 70 deg Thomas test: NT  POSTURE: No Significant postural limitations  PALPATION: deferred  LUMBAR ROM:   AROM eval  Flexion 90%  Extension 75%  Right lateral flexion 75%  Left lateral flexion 75%  Right rotation 50%  Left rotation 50%   (Blank rows = not tested)  LOWER EXTREMITY ROM:   WNL  Active  Right eval Left eval  Hip flexion    Hip extension    Hip abduction    Hip adduction    Hip internal rotation    Hip external rotation    Knee flexion    Knee extension    Ankle dorsiflexion    Ankle  plantarflexion    Ankle inversion    Ankle eversion     (Blank rows = not tested)  LOWER EXTREMITY MMT:  see 30s chair stand  MMT Right eval Left eval  Hip flexion    Hip extension    Hip abduction    Hip adduction    Hip internal rotation    Hip external rotation    Knee flexion    Knee extension  Ankle dorsiflexion    Ankle plantarflexion    Ankle inversion    Ankle eversion     (Blank rows = not tested)  LUMBAR SPECIAL TESTS:  Straight leg raise test: Negative and Slump test: Negative  FUNCTIONAL TESTS:  30 seconds chair stand test  GAIT: Distance walked: 44ft x2 Assistive device utilized: None Level of assistance: Complete Independence   TREATMENT:                                                                                                                             [facility] Adult PT Treatment:                                                DATE: 12/31/24 Eval and HEP Self Care: Additional minutes spent for educating on updated Therapeutic Home Exercise Program as well as comparing current status to condition at start of symptoms. This included exercises focusing on stretching, strengthening, with focus on eccentric aspects. Long term goals include an improvement in range of motion, strength, endurance as well as avoiding reinjury. Patient's frequency would include in 1-2 times a day, 3-5 times a week for a duration of 6-12 weeks. Proper technique shown and discussed handout in great detail. All questions were discussed and addressed.      PATIENT EDUCATION:  Education details: Discussed eval findings, rehab rationale and POC and patient is in agreement  Person educated: Patient Education method: Explanation and Handouts Education comprehension: verbalized understanding and needs further education  HOME EXERCISE PROGRAM: Access Code: [email or web address] URL: [email or web address] Date: 12/31/2024 Prepared by: Reyes Kohut  Exercises - Static Prone on  Elbows  - 1 x daily - 3-5 x weekly - 1 sets - 1 reps - 2 min hold - Seated Table Hamstring Stretch  - 1 x daily - 3-5 x weekly - 1 sets - 2 reps - 30s hold - Supine 90/90 Abdominal Bracing  - 1 x daily - 3-5 x weekly - 1 sets - 2 reps - 30-60s hold  ASSESSMENT:  CLINICAL IMPRESSION: Patient is a 44 y.o. female who was seen today for physical therapy evaluation and treatment for low back pain and BLE paresthesias.  Neural tension signs negative but marked myofascial restrictions noted with assessment and various testing procedures.   Patient does show some weakness in core and LE's as evidenced by 90/90 and 30s chair stand test.  Patient would benefit from OPPT with the goal of establishing a stretching program as well as improving core and LE strength  OBJECTIVE IMPAIRMENTS: decreased activity tolerance, decreased knowledge of condition, increased fascial restrictions, impaired perceived functional ability, increased muscle spasms, and pain.   ACTIVITY LIMITATIONS: carrying, lifting, sitting, and standing  PERSONAL FACTORS: Age, Fitness, Time since onset of injury/illness/exacerbation, and 1 comorbidity: fibromyalgia are also affecting patient's functional outcome.  REHAB POTENTIAL: Fair based on diagnosis of myofascial pain  CLINICAL DECISION MAKING: Stable/uncomplicated  EVALUATION COMPLEXITY: Moderate   GOALS: Goals reviewed with patient? No  SHORT TERM GOALS: Target date: 01/28/2025    Patient to demonstrate independence in HEP  Baseline: [id number] Goal status: INITIAL  2.  80d SLR B Baseline: 70d SLR B Goal status: INITIAL    LONG TERM GOALS: Target date: 02/25/2025    Patient will acknowledge 4/10 pain at least once during episode of care   Baseline: 8/10 Goal status: INITIAL  2.  Patient will increase 30s chair stand reps from 5 to 8 without arms to demonstrate and improved functional ability with less pain/difficulty as well as reduce fall risk.  Baseline: 5 Goal  status: INITIAL  3.  Patient will score at least 27/30 on PSFS to signify clinically meaningful improvement in functional abilities.   Baseline: 24/30 Goal status: INITIAL  4.  Increase trunk AROM to 90% Baseline:  AROM eval  Flexion 90%  Extension 75%  Right lateral flexion 75%  Left lateral flexion 75%  Right rotation 50%  Left rotation 50%   Goal status: INITIAL    PLAN:  PT FREQUENCY: 1-2x/week  PT DURATION: 6 weeks  PLANNED INTERVENTIONS: 97110-Therapeutic exercises, 97530- Therapeutic activity, [id number]- Neuromuscular re-education, 97535- Self Care, [id number]- Manual therapy, and Patient/Family education.  PLAN FOR NEXT SESSION: HEP review and update, manual techniques as appropriate, aerobic tasks, ROM and flexibility activities, strengthening and PREs, TPDN, gait and balance training,aquatic therapy, modalities for pain and NMRE     For all possible CPT codes, reference the Planned Interventions line above.     Check all conditions that are expected to impact treatment: {Conditions expected to impact treatment:None of these apply   If treatment provided at initial evaluation, no treatment charged due to lack of authorization.       Negar Sieler M Newell Frater, PT 01/18/2025, 11:41 AM  "

## 2025-01-21 ENCOUNTER — Ambulatory Visit

## 2025-01-28 ENCOUNTER — Ambulatory Visit

## 2025-02-19 ENCOUNTER — Ambulatory Visit
